# Patient Record
Sex: Male | Born: 1945
Health system: Southern US, Community
[De-identification: ages and names within clinical notes are randomized; demographics above are authoritative.]

## PROBLEM LIST (undated history)

## (undated) DIAGNOSIS — U071 COVID-19: Secondary | ICD-10-CM

## (undated) DIAGNOSIS — N183 Chronic kidney disease, stage 3 unspecified: Secondary | ICD-10-CM

## (undated) DIAGNOSIS — M5030 Other cervical disc degeneration, unspecified cervical region: Secondary | ICD-10-CM

## (undated) DIAGNOSIS — M4800 Spinal stenosis, site unspecified: Secondary | ICD-10-CM

## (undated) DIAGNOSIS — K219 Gastro-esophageal reflux disease without esophagitis: Secondary | ICD-10-CM

## (undated) DIAGNOSIS — M545 Low back pain, unspecified: Secondary | ICD-10-CM

## (undated) DIAGNOSIS — G4700 Insomnia, unspecified: Secondary | ICD-10-CM

## (undated) DIAGNOSIS — C801 Malignant (primary) neoplasm, unspecified: Secondary | ICD-10-CM

## (undated) DIAGNOSIS — E782 Mixed hyperlipidemia: Secondary | ICD-10-CM

## (undated) DIAGNOSIS — Z8679 Personal history of other diseases of the circulatory system: Secondary | ICD-10-CM

## (undated) HISTORY — DX: Low back pain: M54.5

## (undated) HISTORY — PX: COLONOSCOPY: SHX174

## (undated) HISTORY — PX: INGUINAL HERNIA REPAIR: SUR1180

## (undated) HISTORY — DX: Insomnia, unspecified: G47.00

## (undated) HISTORY — PX: CHOLECYSTECTOMY: SHX55

## (undated) HISTORY — PX: APPENDECTOMY: SHX54

## (undated) HISTORY — PX: VEIN LIGATION AND STRIPPING: SHX2653

## (undated) HISTORY — DX: Mixed hyperlipidemia: E78.2

## (undated) HISTORY — DX: COVID-19: U07.1

## (undated) HISTORY — PX: CARDIAC CATHETERIZATION: SHX172

## (undated) HISTORY — DX: Spinal stenosis, site unspecified: M48.00

## (undated) HISTORY — DX: Gastro-esophageal reflux disease without esophagitis: K21.9

## (undated) HISTORY — DX: Other cervical disc degeneration, unspecified cervical region: M50.30

## (undated) HISTORY — DX: Low back pain, unspecified: M54.50

## (undated) HISTORY — DX: Chronic kidney disease, stage 3 unspecified: N18.30

## (undated) HISTORY — DX: Personal history of other diseases of the circulatory system: Z86.79

## (undated) HISTORY — PX: OTHER SURGICAL HISTORY: SHX169

---

## 2000-04-24 ENCOUNTER — Emergency Department (HOSPITAL_COMMUNITY): Admission: EM | Admit: 2000-04-24 | Discharge: 2000-04-24 | Payer: Self-pay | Admitting: Emergency Medicine

## 2000-06-26 ENCOUNTER — Ambulatory Visit (HOSPITAL_COMMUNITY): Admission: RE | Admit: 2000-06-26 | Discharge: 2000-06-26 | Payer: Self-pay | Admitting: *Deleted

## 2000-06-26 ENCOUNTER — Encounter: Payer: Self-pay | Admitting: *Deleted

## 2009-03-02 ENCOUNTER — Encounter: Admission: RE | Admit: 2009-03-02 | Discharge: 2009-03-02 | Payer: Self-pay | Admitting: Neurosurgery

## 2009-04-20 ENCOUNTER — Encounter: Admission: RE | Admit: 2009-04-20 | Discharge: 2009-04-20 | Payer: Self-pay | Admitting: Neurosurgery

## 2009-05-03 ENCOUNTER — Ambulatory Visit: Payer: Self-pay | Admitting: Vascular Surgery

## 2009-08-09 ENCOUNTER — Ambulatory Visit: Payer: Self-pay | Admitting: Vascular Surgery

## 2009-09-06 ENCOUNTER — Ambulatory Visit: Payer: Self-pay | Admitting: Vascular Surgery

## 2009-09-15 ENCOUNTER — Ambulatory Visit: Payer: Self-pay | Admitting: Vascular Surgery

## 2009-10-27 ENCOUNTER — Ambulatory Visit: Payer: Self-pay | Admitting: Vascular Surgery

## 2010-04-16 ENCOUNTER — Ambulatory Visit: Payer: Self-pay | Admitting: Cardiovascular Disease

## 2010-04-16 ENCOUNTER — Observation Stay (HOSPITAL_COMMUNITY): Admission: EM | Admit: 2010-04-16 | Discharge: 2010-04-17 | Payer: Self-pay | Admitting: Emergency Medicine

## 2010-12-06 ENCOUNTER — Ambulatory Visit (INDEPENDENT_AMBULATORY_CARE_PROVIDER_SITE_OTHER): Payer: Self-pay | Admitting: Internal Medicine

## 2010-12-06 DIAGNOSIS — R1013 Epigastric pain: Secondary | ICD-10-CM

## 2010-12-06 DIAGNOSIS — R1011 Right upper quadrant pain: Secondary | ICD-10-CM

## 2010-12-06 DIAGNOSIS — Z7982 Long term (current) use of aspirin: Secondary | ICD-10-CM

## 2010-12-15 LAB — COMPREHENSIVE METABOLIC PANEL
Albumin: 3.8 g/dL (ref 3.5–5.2)
Alkaline Phosphatase: 65 U/L (ref 39–117)
BUN: 25 mg/dL — ABNORMAL HIGH (ref 6–23)
Calcium: 8.9 mg/dL (ref 8.4–10.5)
Potassium: 4 mEq/L (ref 3.5–5.1)
Sodium: 136 mEq/L (ref 135–145)
Total Protein: 6.6 g/dL (ref 6.0–8.3)

## 2010-12-15 LAB — CBC
HCT: 38.7 % — ABNORMAL LOW (ref 39.0–52.0)
HCT: 39 % (ref 39.0–52.0)
Hemoglobin: 13 g/dL (ref 13.0–17.0)
MCH: 31.5 pg (ref 26.0–34.0)
MCH: 32.3 pg (ref 26.0–34.0)
MCHC: 33.3 g/dL (ref 30.0–36.0)
MCHC: 34.2 g/dL (ref 30.0–36.0)
MCV: 94.6 fL (ref 78.0–100.0)
MCV: 94.6 fL (ref 78.0–100.0)
Platelets: 141 10*3/uL — ABNORMAL LOW (ref 150–400)
Platelets: 147 10*3/uL — ABNORMAL LOW (ref 150–400)
RBC: 4.12 MIL/uL — ABNORMAL LOW (ref 4.22–5.81)
RDW: 13.1 % (ref 11.5–15.5)
RDW: 13.1 % (ref 11.5–15.5)
WBC: 6.7 10*3/uL (ref 4.0–10.5)
WBC: 8.4 10*3/uL (ref 4.0–10.5)

## 2010-12-15 LAB — POCT I-STAT, CHEM 8
BUN: 26 mg/dL — ABNORMAL HIGH (ref 6–23)
Calcium, Ion: 0.91 mmol/L — ABNORMAL LOW (ref 1.12–1.32)
Chloride: 109 mEq/L (ref 96–112)
Creatinine, Ser: 1 mg/dL (ref 0.4–1.5)
Glucose, Bld: 114 mg/dL — ABNORMAL HIGH (ref 70–99)
HCT: 40 % (ref 39.0–52.0)
Hemoglobin: 13.6 g/dL (ref 13.0–17.0)
Potassium: 4 mEq/L (ref 3.5–5.1)
Sodium: 135 mEq/L (ref 135–145)
TCO2: 19 mmol/L (ref 0–100)

## 2010-12-15 LAB — CK TOTAL AND CKMB (NOT AT ARMC)
CK, MB: 2 ng/mL (ref 0.3–4.0)
Relative Index: INVALID (ref 0.0–2.5)
Total CK: 52 U/L (ref 7–232)

## 2010-12-15 LAB — BASIC METABOLIC PANEL
BUN: 18 mg/dL (ref 6–23)
Calcium: 8.9 mg/dL (ref 8.4–10.5)
Chloride: 104 mEq/L (ref 96–112)
Creatinine, Ser: 1.12 mg/dL (ref 0.4–1.5)
GFR calc Af Amer: >60 mL/min (ref >60)
GFR calc non Af Amer: >60 mL/min (ref >60)

## 2010-12-15 LAB — POCT CARDIAC MARKERS
CKMB, poc: <1 ng/mL — ABNORMAL LOW (ref 1.0–8.0)
Myoglobin, poc: 68.5 ng/mL (ref 12–200)

## 2010-12-15 LAB — LIPID PANEL
Cholesterol: 150 mg/dL (ref 0–200)
HDL: 57 mg/dL (ref >39)
Triglycerides: 69 mg/dL (ref <150)
VLDL: 14 mg/dL (ref 0–40)

## 2010-12-15 LAB — D-DIMER, QUANTITATIVE: D-Dimer, Quant: 0.24 ug/mL-FEU (ref 0.00–0.48)

## 2010-12-15 LAB — CARDIAC PANEL(CRET KIN+CKTOT+MB+TROPI)
CK, MB: 1.7 ng/mL (ref 0.3–4.0)
Relative Index: INVALID (ref 0.0–2.5)
Relative Index: INVALID (ref 0.0–2.5)
Total CK: 42 U/L (ref 7–232)
Troponin I: 0.01 ng/mL (ref 0.00–0.06)
Troponin I: 0.01 ng/mL (ref 0.00–0.06)

## 2010-12-15 LAB — PROTIME-INR
INR: 1.02 (ref 0.00–1.49)
Prothrombin Time: 13.3 seconds (ref 11.6–15.2)

## 2010-12-15 LAB — TROPONIN I: Troponin I: 0.03 ng/mL (ref 0.00–0.06)

## 2010-12-15 LAB — TSH: TSH: 1.652 u[IU]/mL (ref 0.350–4.500)

## 2011-01-02 ENCOUNTER — Encounter (HOSPITAL_BASED_OUTPATIENT_CLINIC_OR_DEPARTMENT_OTHER): Payer: PRIVATE HEALTH INSURANCE | Admitting: Internal Medicine

## 2011-01-02 ENCOUNTER — Ambulatory Visit (HOSPITAL_COMMUNITY)
Admission: RE | Admit: 2011-01-02 | Discharge: 2011-01-02 | Disposition: A | Discharge status: Home / Self Care | Payer: PRIVATE HEALTH INSURANCE | Source: Ambulatory Visit | Attending: Internal Medicine | Admitting: Internal Medicine

## 2011-01-02 DIAGNOSIS — R1013 Epigastric pain: Secondary | ICD-10-CM | POA: Insufficient documentation

## 2011-01-02 DIAGNOSIS — R1011 Right upper quadrant pain: Secondary | ICD-10-CM

## 2011-01-02 DIAGNOSIS — Z79899 Other long term (current) drug therapy: Secondary | ICD-10-CM | POA: Insufficient documentation

## 2011-01-02 DIAGNOSIS — Z7982 Long term (current) use of aspirin: Secondary | ICD-10-CM | POA: Insufficient documentation

## 2011-01-02 LAB — HEPATIC FUNCTION PANEL
ALT: 17 U/L (ref 0–53)
Albumin: 3.5 g/dL (ref 3.5–5.2)
Alkaline Phosphatase: 51 U/L (ref 39–117)
Indirect Bilirubin: 1.1 mg/dL — ABNORMAL HIGH (ref 0.3–0.9)
Total Protein: 5.8 g/dL — ABNORMAL LOW (ref 6.0–8.3)

## 2011-01-15 NOTE — Consult Note (Signed)
NAME:  Kurt Duke, HAZEL NO.:  [id number]  MEDICAL RECORD NO.:  [id number]           PATIENT TYPE: AMB.  LOCATION: [place].                   [facility]  PHYSICIAN:  Lionel December, M.D.    DATE OF BIRTH:  March 26, 1946  DATE : 12/06/2010.                                 OFFICE VISIT.   REASON FOR CONSULTATION:  Chest pain/EGD.  HISTORY OF PRESENT ILLNESS:  Mr. Kurt Duke is a 65 year old white male referred to our office by Dr. Sherril Croon for chest pain.  He complains of epigastric pain and pain in his right upper quadrant.  He states the pain becomes worse if he rides for a long time or if he eats.  This pain has been occurring for several months.  The pain is actually located under his right anterior ribs.  He does occasionally have some nausea. He has no vomiting.  His appetite has been good.  He does state he has early satiety because he feels full.  He complains of fullness in his abdomen.  He complains of a lot of gas.  There has been no weight loss.  He has a bowel movement about 1-2 times a week and uses Dulcolax. He denies any fever, fatigue, or weight loss.  There has been no dysphagia.  No jaundice.  No vomiting but he does have nausea.  He says his last colonoscopy was about 10 years ago by Dr. Linna Darner and it was normal.  LABS:  September 09, 2010, his BUN was 23, creatinine was 1.10, potassium 4.0, chloride 100, carbon dioxide 21, calcium 9.4, protein 6.8, albumin 4.4, globulin 2.4.  Total bilirubin 0.6, ALP 72, AST 20, ALT 13.  WBC is 8.2, RBC 4.36.  Hemoglobin 13.8, hematocrit 38.9, MCV 89, and his platelets 13.2.  TSH was 1.310.  PSA was 2.5.  There are no known allergies.  HOME MEDICATIONS: 1. Vitamin D 1000 units 2 a day. 2. Ambien 10 mg at night. 3. Omeprazole 20 mg daily. 4. Fish oil 1000 mg a day. 5. Dulcolax as needed. 6. Tylenol Arthritis as needed. 7. Aspirin 81 mg a day. 8. Beano as needed. 9. Hydrocodone/Tylenol as  needed. 10.Anti-Gas as needed. 11.Motrin 200 mg p.r.n. which is rare. 12.Oxycodone as needed.  SURGERY:  He has had a neuroma removed from his right foot.  He has had arthroscopy of his right knee and then he had open surgery to his right knee.  He has had a left knee arthroscopy, tonsillectomy, cholecystectomy 10 years ago, a vein stripping to his left leg, appendectomy, and bilateral inguinal hernia repair.  MEDICAL HISTORY:  Back pain.  FAMILY HISTORY:  His mother deceased at age 60.  His father is deceased when he was a child from an accident.  One sister with diabetes type 2 and she has had throat and breast cancer.  SOCIAL HISTORY:  He is married.  He works at [employer] in [place]. He does not smoke or do drugs and he occasionally drinks 1-2 a week. Three children, two in good health.  One is deceased from an overdose.  OBJECTIVE:  VITAL SIGNS:  His weight is 181, his  height is 5 feet 11 inches, his temp is 97.3, his blood pressure is 104/82, and his pulse is 76. HEENT:  He has natural teeth.  His oral mucosa is moist.  His conjunctivae pink.  His sclerae anicteric. NECK:  His thyroid is normal.  There is no cervical lymphadenopathy. LUNGS:  Clear. HEART:  Regular rate and rhythm. ABDOMEN:  Soft.  Bowel sounds are positive.  No masses.  There is no hepatomegaly.  He does have tenderness to the epigastric region.  There is no edema to his extremities.  ASSESSMENT:  Shahzad is a 65 year old male with complaints of epigastric and right upper quadrant pain.  Peptic ulcer disease needs to be ruled out.  His labs so far have been normal.  RECOMMENDATIONS:  We will schedule an EGD with Dr. Karilyn Cota in the near future.  If normal, he will probably need further studies such as a CT of the abdomen.    ______________________________ Dorene Ar, NP   ______________________________ Lionel December, M.D.    TS/MEDQ  D:  12/06/2010  T:  12/07/2010  Job:  [id number]  cc:   Dr.  Sherril Croon  Electronically Signed by Dorene Ar PA on 01/02/2011 09:11:40 AM Electronically Signed by Lionel December M.D. on 01/15/2011 10:25:41 AM

## 2011-01-15 NOTE — Op Note (Signed)
  NAME:  Kurt Duke, Kurt Duke NO.:  [id number]  MEDICAL RECORD NO.:  [id number]           PATIENT TYPE:  O  LOCATION:  [place]                          FACILITY:  [facility]  PHYSICIAN:  Lionel December, M.D.    DATE OF BIRTH:  02-17-1946  DATE OF PROCEDURE: DATE OF DISCHARGE:                              OPERATIVE REPORT   PROCEDURE:  Esophagogastroduodenoscopy.  INDICATION:  Kurt Duke is a 65 year old Caucasian male who has been experiencing epigastric pain for the last 3 to 4 months.  He has been on omeprazole but does not feel any better.  He had normal CBC and LFTs. He has had gallbladder removed 10 years ago.  He has been using lot of OTC Motrin, but he stopped it but is still having his pain.  He has not experienced any melena or anorexia, weight loss.  He is undergoing diagnostic EGD.  Procedure risk reviewed with the patient, informed consent was obtained.  MEDS FOR CONSCIOUS SEDATION:  Cetacaine spray for pharyngeal topical anesthesia, Demerol 50 mg IV, Versed 8 mg IV.  FINDINGS:  Procedure performed in endoscopy suite.  The patient's vital signs and O2 sat were monitored during the procedure and remained stable.  The patient was placed in left lateral recumbent position and Pentax videoscope was passed through oropharynx without any difficulty into esophagus.  Esophagus:  Mucosa of the esophagus is normal.  GE junction was located at 40 cm from the incisors and was unremarkable.  Stomach was empty and distended very well insufflation.  Folds of proximal stomach are normal.  Examination of mucosa at body, antrum, pyloric channel as well as angularis, fundus, and cardia was normal.  Duodenum, bulbar mucosa was normal.  Scope was passed through second part of duodenal mucosa and folds were normal.  Ampulla were also well seen and was normal.  Endoscope was withdrawn.  The patient tolerated the procedure well.  FINAL DIAGNOSIS:  Normal  esophagogastroduodenoscopy.  RECOMMENDATIONS:  The patient will continue to use omeprazole at current dose just 20 mg daily.  He will continue to refrain from using NSAIDs, but can stay on his ASA 81 mg daily.  Abdominopelvic CT will be scheduled in near future.     Lionel December, M.D.     NR/MEDQ  D:  01/02/2011  T:  01/03/2011  Job:  [id number]  cc:   Dr. Sherril Croon  Electronically Signed by Lionel December M.D. on 01/15/2011 10:26:02 AM

## 2011-02-12 NOTE — Assessment & Plan Note (Signed)
OFFICE VISIT   Kurt Duke, Kurt Duke  DOB:  07/20/1946                                       08/09/2009  AOZHY#:[id number]   This patient presents today for continued discussion of his left leg  venous pathology.  He has attempted graduated compression use and this  has not improved the pain.  He specifically has pain over his pretibial  area, and this is related mostly to standing.  He does have some  discomfort with elevation.  He reports this is an achy discomfort.  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  This is very difficult due to pain over the  varicosities and also reports that yard work and walking are difficult  due to the leg pain.   REVIEW OF SYSTEMS:  Otherwise unremarkable.  He has no cardiac,  pulmonary, GI or GU difficulties.   PHYSICAL EXAMINATION:  Unchanged.  He does have 2+ dorsalis pedis pulse.  He has marked varicosities over left pretibial area.   I imaged this today with a duplex again showing the perforator in the  lateral aspect of his calf extending into the large varicosities.   I again discussed this with the patient.  He has clearly failed  conservative treatment and have recommended laser ablation of his left  perforator vein and stab phlebectomy of multiple tributary varicosities.  He understands the procedure and wishes to proceed when we can get him  on the schedule and assure insurance coverage.   Larina Earthly, M.D.  Electronically Signed   TFE/MEDQ  D:  08/09/2009  T:  08/10/2009  Job:  [id number]

## 2011-02-12 NOTE — Assessment & Plan Note (Signed)
OFFICE VISIT   EZRAH, PANNING  DOB:  Apr 21, 1946                                       09/15/2009  ZOXWR#:[id number]   The patient presents today for 1-week follow-up of his laser ablation of  his left proximal calf perforator and multiple stab phlebectomies.  He  had minimal discomfort following this procedure.  He has mild bruising  in his incisions.  Steri-Strip sites are  separating.  He underwent  duplex in our office that shows no evidence of DVT and shows ablation of  his reticular.  I am quite pleased with his initial result.  He will  wear his compression garment for 1 additional month.  We will see him  again in 6 weeks for final follow-up.     Larina Earthly, M.D.  Electronically Signed   TFE/MEDQ  D:  09/15/2009  T:  09/19/2009  Job:  [id number]   cc:   Erasmo Downer, MD  Payton Doughty, M.D.

## 2011-02-12 NOTE — Assessment & Plan Note (Signed)
OFFICE VISIT   Kurt Duke, Kurt Duke  DOB:  1946-02-07                                       09/06/2009  JXBJY#:[id number]   The patient presents today for treatment of symptomatic left leg venous  varicosities.  He had a perforator in the anterior tibial area just  below the knee and this was treated with laser ablation followed by stab  phlebectomy of multiple tributary varicosities through his calf and  pretibial area.  He had no immediate complications and will be seen  again in 1 week for followup.   Larina Earthly, M.D.  Electronically Signed   TFE/MEDQ  D:  09/06/2009  T:  09/07/2009  Job:  [id number]

## 2011-02-12 NOTE — Assessment & Plan Note (Signed)
OFFICE VISIT   MAYNARD, Kurt Duke  DOB:  [birth year]                                       10/27/2009  BMWUX#:[id number]   The patient presents today for follow-up of his laser ablation of a  perforator from the anterior tibial vein in his left pretibial area and  stab phlebectomy of multiple tributary varicosities.  He has done quite  well.  He reports no further discomfort.  His phlebectomy sites have all  healed, his bruising is all resolved.  I did image this area again with  the SonoSite and this shows complete closure of the perforator.  I am  pleased with his initial result as is the patient. Plan to see him again  on an as-needed basis.     Larina Earthly, M.D.  Electronically Signed   TFE/MEDQ  D:  10/27/2009  T:  10/30/2009  Job:  [id number]

## 2011-02-12 NOTE — Procedures (Signed)
LOWER EXTREMITY VENOUS REFLUX EXAM   INDICATION:  Left calf varicose vein.   EXAM:  Using color-flow imaging and pulse Doppler spectral analysis, the  right and left common femoral, superficial femoral, popliteal, posterior  tibial, greater and lesser saphenous veins are evaluated.  There is  evidence suggesting deep venous insufficiency in the right and left  lower extremity.   The right and left saphenofemoral junctions are competent.  The right  and left GSV are competent with the caliber as described below.   The right and left proximal short saphenous veins demonstrates  competency.   GSV Diameter (used if found to be incompetent only)                                            Right    Left  Proximal Greater Saphenous Vein           cm       cm  Proximal-to-mid-thigh                     cm       cm  Mid thigh                                 cm       cm  Mid-distal thigh                          cm       cm  Distal thigh                              cm       cm  Knee                                      cm       cm   IMPRESSION:  1. Right and left greater saphenous veins are identified.  2. Right and left greater saphenous veins are not aneurysmal.  3. Right and left greater saphenous veins are not tortuous.  4. The deep venous system is not competent with reflux of >500      milliseconds bilaterally.  5. The right and left lesser saphenous veins are competent.  6. On the right there is an incompetent deep calf vein right off of      the popliteal vein (gastrocnemius).  7. On the left there is a large perforator on the anterolateral aspect      of the proximal calf of the anterior tibial vein which gives rise      to a large network of tortuous varicosities.  The perforator is      approximately 0.6 cm in diameter.  The varicose vein is      approximately 0.4 cm in diameter.       ___________________________________________  Larina Earthly, M.D.   MC/MEDQ  D:  05/03/2009  T:  05/03/2009  Job:  [id number]

## 2011-02-12 NOTE — Procedures (Signed)
DUPLEX DEEP VENOUS EXAM - LOWER EXTREMITY   INDICATION:  Followup left calf perforator ablation.   HISTORY:  Edema:  No.  Trauma/Surgery:  Left tibial perforator vein ablation 09/06/2009 by Dr.  Arbie Cookey.  Pain:  No.  PE:  No.  Previous DVT:  No.  Anticoagulants:  No.  Other:   DUPLEX EXAM:                CFV   SFV   PopV  PTV    GSV                R  L  R  L  R  L  R   L  R  L  Thrombosis    0  0     0     0      0     0  Spontaneous   +  +     +     +      +     +  Phasic        +  +     +     +      +     +  Augmentation  +  +     +     +      +     +  Compressible  +  +     +     +      +     +  Competent     0  0     +     +      +     +   Legend:  + - yes  o - no  p - partial  D - decreased   IMPRESSION:  1. No evidence of deep venous thrombosis in the left lower extremity      or right common femoral vein.  2. Evidence of ablated left proximal calf (anterior / lateral)      perforator without flow.  3. Evidence of thrombosed varicosities in the left calf.    _____________________________  Larina Earthly, M.D.   AS/MEDQ  D:  09/15/2009  T:  09/16/2009  Job:  [id number]

## 2011-02-12 NOTE — Consult Note (Signed)
NEW PATIENT CONSULTATION   Kurt Duke, Kurt Duke  DOB:  06/05/1946                                       05/03/2009  ZOXWR#:[id number]   The patient presents today for evaluation of left leg venous  varicosities.  He reports that these have been present for many years,  but have become progressively more severe and more painful to him over  the past several months.  He does not have any history of deep vein  thrombosis or superficial thrombophlebitis.  He reports that the  varicosities cause him pain to touch over the varicosities with  prolonged standing, and also a tired achy sensation in his left leg.  He  does have swelling below this.  He does not have any history of bleeding  with his biopsies.   PAST HISTORY:  Significant for no major cardiac difficulties.  He is  married with 3 children and works as a Control and instrumentation engineer.  He does not  smoke and has occasional social alcohol consumption weekly.   REVIEW OF SYSTEMS:  Positive for weight reported 170 pounds.  He is 5  feet 11 inches tall.  Review of systems otherwise negative.   CURRENT MEDICATIONS:  Ambien for sleep and codeine as needed for pain.   PHYSICAL EXAM:  Well-developed, well-nourished white male appearing  stated age of 34.  His blood pressure is 128/88, pulse 80, respirations  18.  He is grossly intact neurologically.  He has 2+ radial and 2+  posterior tibial pulses bilaterally.  He does not have any varicosities  in his right leg.  His left leg is noted for varicosities beginning just  below his knee and just lateral to the tibia, extending down onto his  lateral calf.   He underwent noninvasive vascular laboratory studies in our office and  this revealed no evidence of incompetence in his small or great  saphenous vein.  He does have incompetence in the common femoral and  popliteal veins.  He has a large perforator at the proximal-most portion  of the varicosities in his left tibial area and this  perforator extends  into the large varicosity with reflux.  I discussed options with the  patient.  He has not tried compression garments, and I have recommended  this as our initial treatment.  He understands that he should continue  elevation when possible to reduce the venous hypertension and also will  use Motrin and antiinflammatories p.r.n. pain.  We have fitted him today  with thigh high graduated compression garments, and instructed him on  their use.  We plan to see him again in 3 months.  I did discuss the  option of laser ablation of the perforator vein and stab phlebectomy of  the underlying varicosities should he fail conservative treatment.  We  will see him again in 3 months.   Larina Earthly, M.Duke.  Electronically Signed   TFE/MEDQ  Duke:  05/03/2009  T:  05/03/2009  Job:  [id number]   cc:   Payton Doughty, M.Duke.  Dr. Judie Petit. Kathie Rhodes. Linna Darner

## 2011-03-12 ENCOUNTER — Ambulatory Visit (INDEPENDENT_AMBULATORY_CARE_PROVIDER_SITE_OTHER): Payer: PRIVATE HEALTH INSURANCE | Admitting: Internal Medicine

## 2011-03-12 ENCOUNTER — Ambulatory Visit (INDEPENDENT_AMBULATORY_CARE_PROVIDER_SITE_OTHER): Payer: Medicare Other | Admitting: Internal Medicine

## 2011-03-12 DIAGNOSIS — K915 Postcholecystectomy syndrome: Secondary | ICD-10-CM

## 2011-03-12 DIAGNOSIS — R1011 Right upper quadrant pain: Secondary | ICD-10-CM

## 2011-03-13 ENCOUNTER — Other Ambulatory Visit (INDEPENDENT_AMBULATORY_CARE_PROVIDER_SITE_OTHER): Payer: Self-pay | Admitting: Internal Medicine

## 2011-03-13 DIAGNOSIS — R1011 Right upper quadrant pain: Secondary | ICD-10-CM

## 2011-03-14 ENCOUNTER — Ambulatory Visit (INDEPENDENT_AMBULATORY_CARE_PROVIDER_SITE_OTHER): Payer: Medicare Other | Admitting: Internal Medicine

## 2011-03-21 ENCOUNTER — Ambulatory Visit (HOSPITAL_COMMUNITY)
Admission: RE | Admit: 2011-03-21 | Discharge: 2011-03-21 | Disposition: A | Discharge status: Home / Self Care | Payer: PRIVATE HEALTH INSURANCE | Source: Ambulatory Visit | Attending: Internal Medicine | Admitting: Internal Medicine

## 2011-03-21 DIAGNOSIS — R1011 Right upper quadrant pain: Secondary | ICD-10-CM | POA: Insufficient documentation

## 2012-07-17 IMAGING — US US ABDOMEN COMPLETE
1 series · 14 of 25 positions shown · non-contrast
Comparison: None

CLINICAL DATA: Right upper quadrant pain

ULTRASOUND ABDOMEN:
TECHNIQUE: Sonography of upper abdominal structures was performed.

[Series 1: us abdomen complete · 0.18mm/px · 14 of 55 slices shown]
[im 1/55]
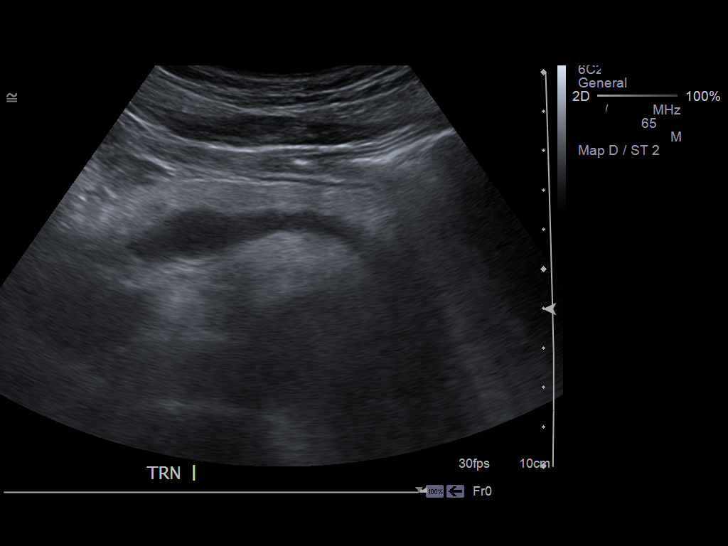
[im 5/55]
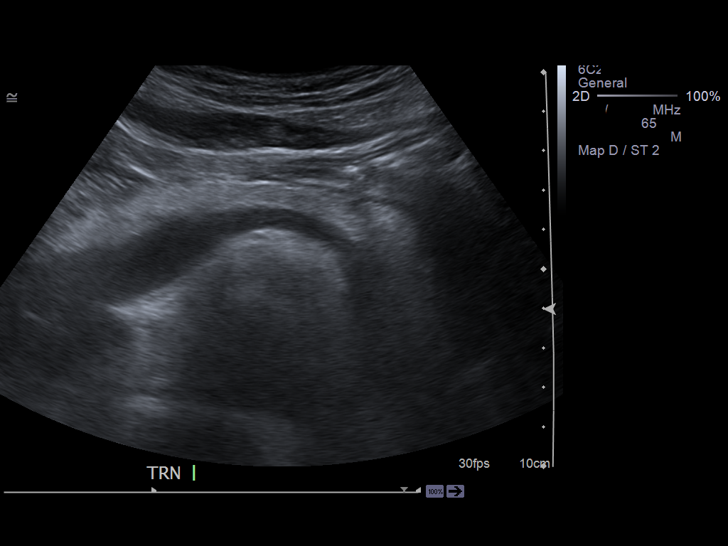
[im 10/55]
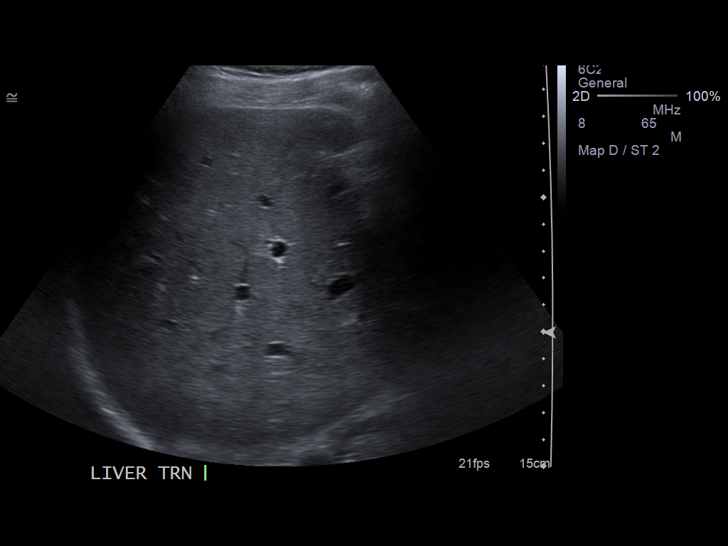
[im 14/55]
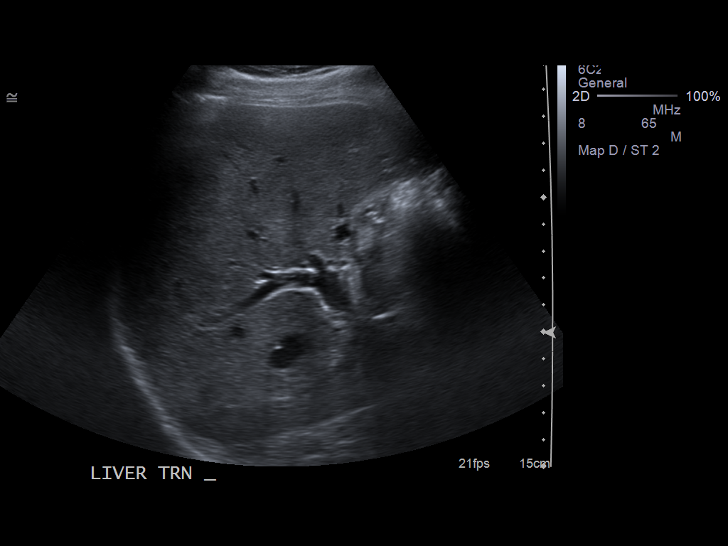
[im 19/55]
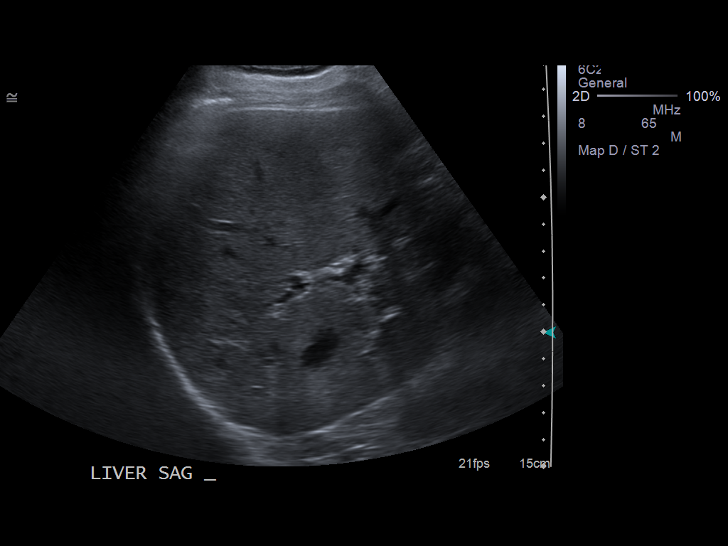
[im 21/55]
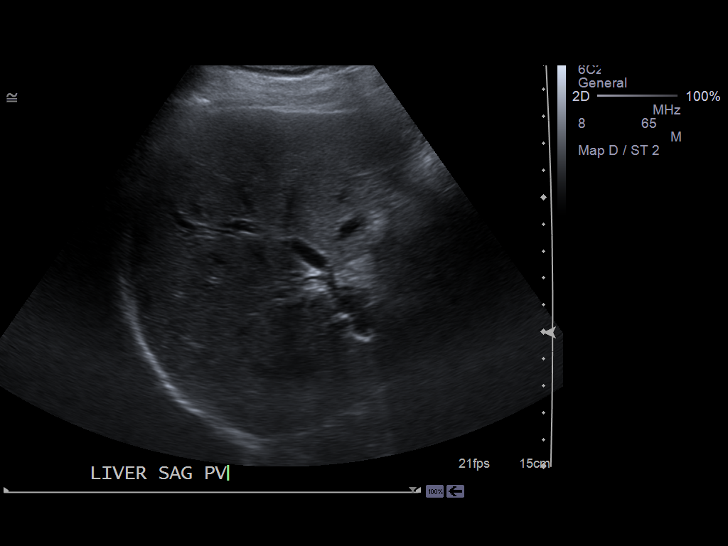
[im 25/55]
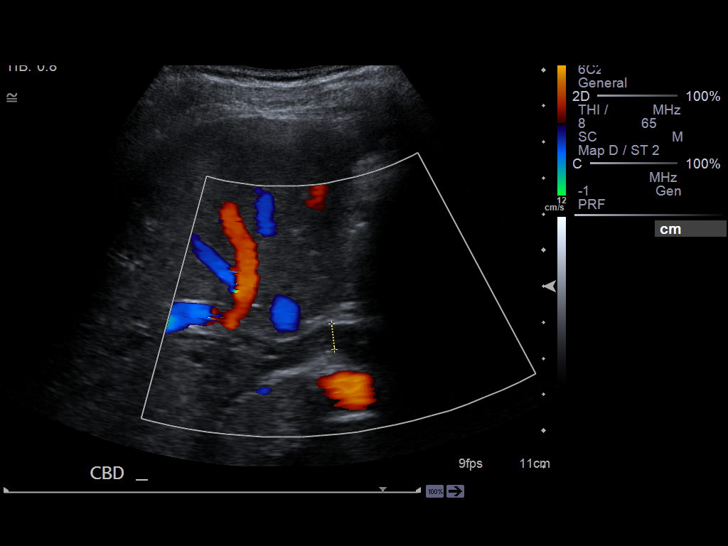
[im 30/55]
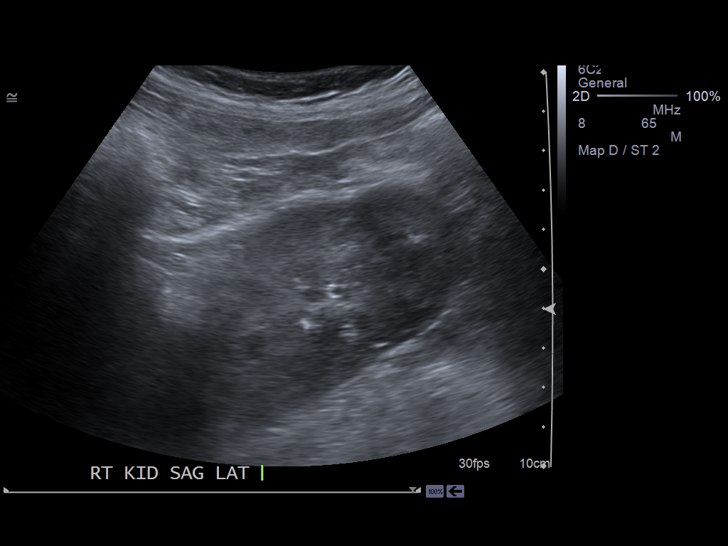
[im 34/55]
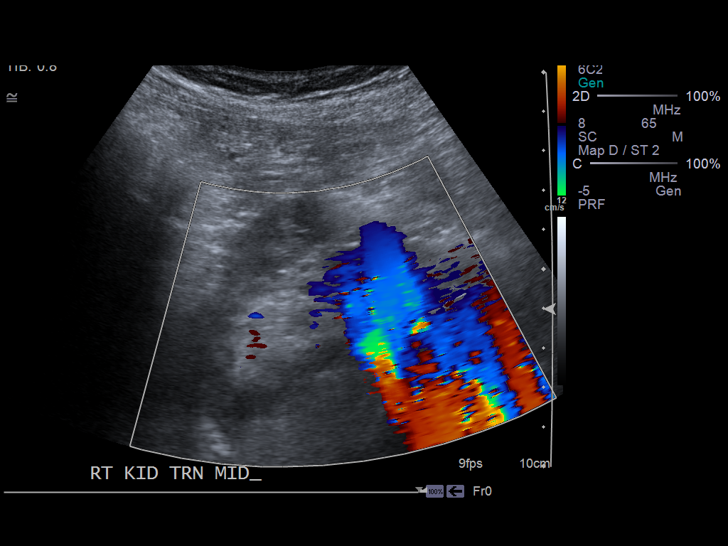
[im 37/55]
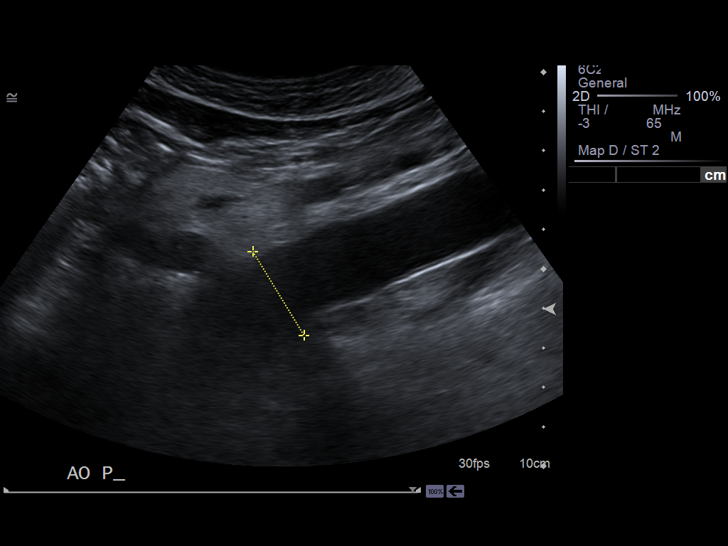
[im 41/55]
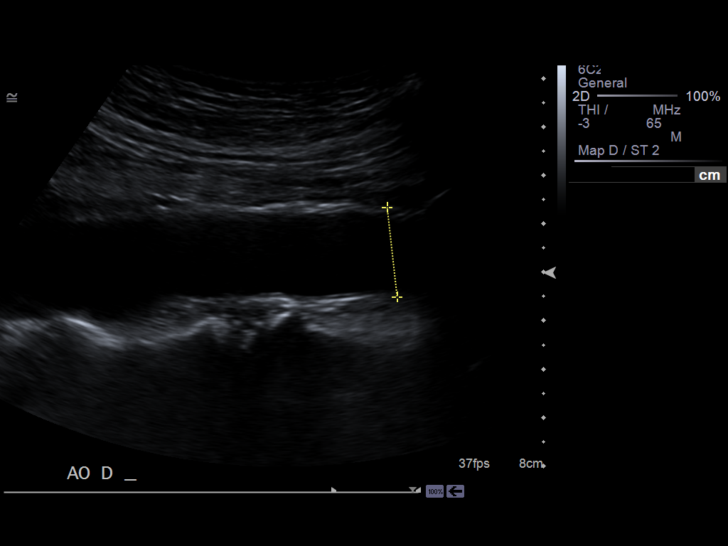
[im 46/55]
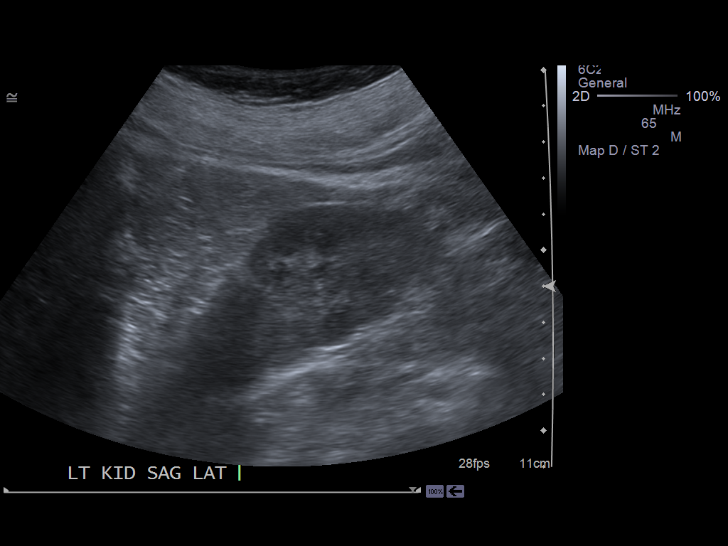
[im 50/55]
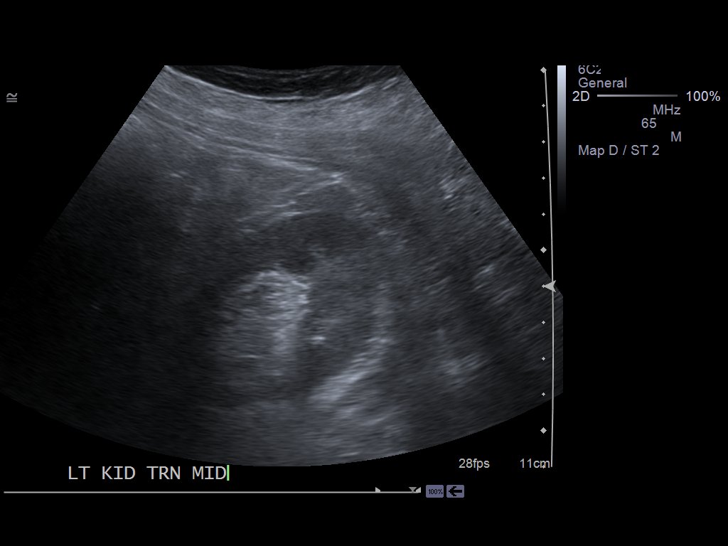
[im 55/55]
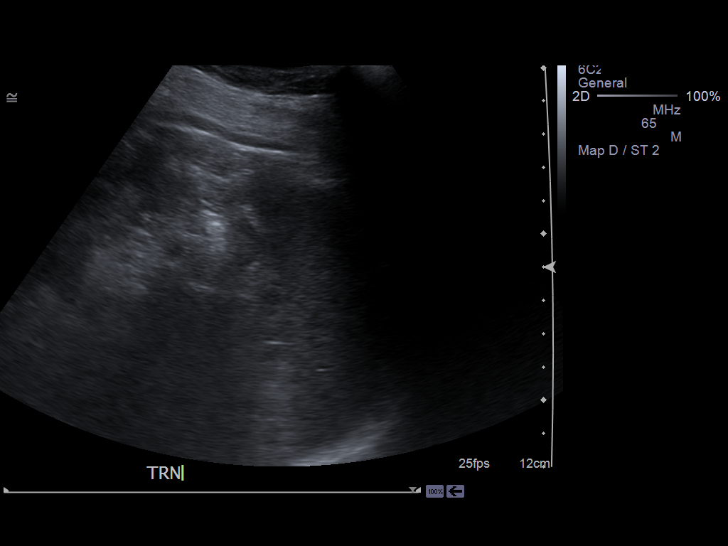

[14 of 25 positions shown; findings below may reference images not displayed]

Gallbladder:  Surgically absent

Common bile duct:  Normal caliber for post cholecystectomy and age,
7 mm diameter.

Liver:  Normal appearance.  Hepatopetal portal venous flow.

IVC:  Normal appearance

Pancreas:  Normal appearance

Spleen:  4.4 cm length.  Normal morphology.

Right kidney:  9.5 cm length. Normal morphology without mass or
hydronephrosis.

Left kidney:  10.9 cm length. Normal morphology without mass or
hydronephrosis.

Aorta:  Normal caliber

Other:  No free fluid
IMPRESSION: Post cholecystectomy.
Otherwise normal exam.

## 2012-09-21 ENCOUNTER — Encounter: Payer: Self-pay | Admitting: Cardiology

## 2012-10-05 ENCOUNTER — Ambulatory Visit (INDEPENDENT_AMBULATORY_CARE_PROVIDER_SITE_OTHER): Payer: Medicare Other | Admitting: Cardiology

## 2012-10-05 ENCOUNTER — Encounter: Payer: Self-pay | Admitting: Cardiology

## 2012-10-05 ENCOUNTER — Encounter: Payer: Self-pay | Admitting: *Deleted

## 2012-10-05 VITALS — BP 130/83 | HR 95 | Ht 72.0 in | Wt 176.0 lb

## 2012-10-05 DIAGNOSIS — R072 Precordial pain: Secondary | ICD-10-CM | POA: Insufficient documentation

## 2012-10-05 NOTE — Patient Instructions (Addendum)
Your physician recommends that you schedule a follow-up appointment as needed. Your physician recommends that you continue on your current medications as directed. Please refer to the Current Medication list given to you today.  Your physician has requested that you have an exercise tolerance test. For further information please visit [email or web address] Please also follow instruction sheet, as given.

## 2012-10-05 NOTE — Progress Notes (Signed)
HPI The patient presents for evaluation of chest discomfort. He has a history of this with catheterization in 2001 and most recently in 2011. He had mild nonobstructive plaque and I did review these results. He now says he's been having chest discomfort since about Thanksgiving.  He had one severe episode which was 7/10 in intensity. He describes some mid sternal discomfort and discomfortunder his left breast. It was somewhat sharp. It was associated with diaphoresis and mild nausea. He's been getting this intermittently. He cannot bring it on with activity. It seems to occur at rest and sporadically. He might last up to 20-30 minutes. He's not sure whether it is similar to his previous discomfort. He hasn't been overly active though he walks up and down stairs without bringing on any discomfort. He denies shortness of breath, PND or orthopnea. He has had no weight gain or edema. He did have an echocardiogram done last month which was negative for any evidence of wall motion abnormality.   Allergies  Allergen Reactions  . Doxycycline Other (See Comments)    Skin sensitivity    Current Outpatient Prescriptions  Medication Sig Dispense Refill  . aspirin EC 81 MG tablet Take 81 mg by mouth daily.      . Cholecalciferol (VITAMIN D3) 2000 UNITS capsule Take 2,000 Units by mouth daily.      Marland Kitchen docusate sodium (COLACE) 100 MG capsule Take 100-200 mg by mouth daily.      . sildenafil (VIAGRA) 100 MG tablet Take 100 mg by mouth daily as needed.      . traMADol (ULTRAM) 50 MG tablet Take 50 mg by mouth 3 (three) times daily as needed.      . zolpidem (AMBIEN) 10 MG tablet Take 10 mg by mouth at bedtime.        No past medical history on file.  Past Surgical History  Procedure Date  . Colonoscopy     2009  . Cardiac catheterization     2010   ROS:  As stated in the HPI and negative for all other systems.  PHYSICAL EXAM BP 130/83  Pulse 95  Ht 6' (1.829 m)  Wt 176 lb (79.833 kg)  BMI 23.87  kg/m2  SpO2 98% GENERAL:  Well appearing HEENT:  Pupils equal round and reactive, fundi not visualized, oral mucosa unremarkable NECK:  No jugular venous distention, waveform within normal limits, carotid upstroke brisk and symmetric, no bruits, no thyromegaly LYMPHATICS:  No cervical, inguinal adenopathy LUNGS:  Clear to auscultation bilaterally BACK:  No CVA tenderness CHEST:  Unremarkable HEART:  PMI not displaced or sustained,S1 and S2 within normal limits, no S3, no S4, no clicks, no rubs, no murmurs ABD:  Flat, positive bowel sounds normal in frequency in pitch, no bruits, no rebound, no guarding, no midline pulsatile mass, no hepatomegaly, no splenomegaly EXT:  2 plus pulses throughout, no edema, no cyanosis no clubbing SKIN:  No rashes no nodules NEURO:  Cranial nerves II through XII grossly intact, motor grossly intact throughout PSYCH:  Cognitively intact, oriented to person place and time   EKG:  Sinus rhythm, rate 95, left axis deviation, left anterior fascicular block, no acute ST-T wave changes. No change from previous EKGs. 10/05/2012   ASSESSMENT AND PLAN  Chest pain - Chest pain is somewhat atypical. I think the pretest probability of obstructive coronary disease is on the low side. I will bring the patient back for a POET (Plain Old Exercise Test). This will allow me  to screen for obstructive coronary disease, risk stratify and very importantly provide a prescription for exercise.  Abnormal EKG - I was able to find an EKG from 2011 which was not markedly different from this. Again this will be evaluated as above. Given the normal o the treadmill test is normal no further cardiovascular testing would be suggested.

## 2012-10-06 ENCOUNTER — Other Ambulatory Visit: Payer: Self-pay | Admitting: *Deleted

## 2012-10-06 DIAGNOSIS — R072 Precordial pain: Secondary | ICD-10-CM

## 2012-10-09 DIAGNOSIS — R079 Chest pain, unspecified: Secondary | ICD-10-CM

## 2012-10-16 ENCOUNTER — Telehealth: Payer: Self-pay | Admitting: *Deleted

## 2012-10-16 NOTE — Telephone Encounter (Signed)
Patient informed via voicemail. Copy sent to PCP 

## 2012-10-16 NOTE — Telephone Encounter (Signed)
Message copied by Eustace Moore on Fri Oct 16, 2012  3:36 PM ------      Message from: Rollene Rotunda      Created: Wed Oct 14, 2012 12:34 PM       Negative stress test. No further work up.  Call Mr. Lannen with the results and send results to VYAS,DHRUV B., MD

## 2014-03-31 ENCOUNTER — Encounter (INDEPENDENT_AMBULATORY_CARE_PROVIDER_SITE_OTHER): Payer: Medicare Other | Admitting: Ophthalmology

## 2014-03-31 DIAGNOSIS — H4310 Vitreous hemorrhage, unspecified eye: Secondary | ICD-10-CM

## 2014-03-31 DIAGNOSIS — H43819 Vitreous degeneration, unspecified eye: Secondary | ICD-10-CM

## 2014-03-31 DIAGNOSIS — H2510 Age-related nuclear cataract, unspecified eye: Secondary | ICD-10-CM

## 2014-05-12 ENCOUNTER — Encounter (INDEPENDENT_AMBULATORY_CARE_PROVIDER_SITE_OTHER): Payer: Medicare Other | Admitting: Ophthalmology

## 2014-05-12 DIAGNOSIS — H43819 Vitreous degeneration, unspecified eye: Secondary | ICD-10-CM

## 2014-05-12 DIAGNOSIS — H4310 Vitreous hemorrhage, unspecified eye: Secondary | ICD-10-CM

## 2014-05-12 DIAGNOSIS — H35369 Drusen (degenerative) of macula, unspecified eye: Secondary | ICD-10-CM

## 2014-07-12 ENCOUNTER — Encounter (INDEPENDENT_AMBULATORY_CARE_PROVIDER_SITE_OTHER): Payer: Medicare Other | Admitting: Ophthalmology

## 2014-07-13 ENCOUNTER — Encounter (INDEPENDENT_AMBULATORY_CARE_PROVIDER_SITE_OTHER): Payer: Medicare Other | Admitting: Ophthalmology

## 2014-07-13 DIAGNOSIS — H43813 Vitreous degeneration, bilateral: Secondary | ICD-10-CM

## 2014-07-13 DIAGNOSIS — H2513 Age-related nuclear cataract, bilateral: Secondary | ICD-10-CM

## 2016-02-19 ENCOUNTER — Encounter (INDEPENDENT_AMBULATORY_CARE_PROVIDER_SITE_OTHER): Payer: Medicare Other | Admitting: Ophthalmology

## 2016-02-19 DIAGNOSIS — H2513 Age-related nuclear cataract, bilateral: Secondary | ICD-10-CM

## 2016-02-19 DIAGNOSIS — H43813 Vitreous degeneration, bilateral: Secondary | ICD-10-CM | POA: Diagnosis not present

## 2016-02-19 DIAGNOSIS — H4312 Vitreous hemorrhage, left eye: Secondary | ICD-10-CM | POA: Diagnosis not present

## 2016-02-19 DIAGNOSIS — D3132 Benign neoplasm of left choroid: Secondary | ICD-10-CM

## 2016-03-18 DIAGNOSIS — Z299 Encounter for prophylactic measures, unspecified: Secondary | ICD-10-CM | POA: Diagnosis not present

## 2016-03-18 DIAGNOSIS — G4700 Insomnia, unspecified: Secondary | ICD-10-CM | POA: Diagnosis not present

## 2016-03-18 DIAGNOSIS — M545 Low back pain: Secondary | ICD-10-CM | POA: Diagnosis not present

## 2016-04-08 ENCOUNTER — Encounter (INDEPENDENT_AMBULATORY_CARE_PROVIDER_SITE_OTHER): Payer: Medicare Other | Admitting: Ophthalmology

## 2016-04-08 DIAGNOSIS — H4312 Vitreous hemorrhage, left eye: Secondary | ICD-10-CM | POA: Diagnosis not present

## 2016-04-08 DIAGNOSIS — D3132 Benign neoplasm of left choroid: Secondary | ICD-10-CM

## 2016-04-08 DIAGNOSIS — H2513 Age-related nuclear cataract, bilateral: Secondary | ICD-10-CM | POA: Diagnosis not present

## 2016-04-08 DIAGNOSIS — H43813 Vitreous degeneration, bilateral: Secondary | ICD-10-CM

## 2016-04-10 DIAGNOSIS — M9903 Segmental and somatic dysfunction of lumbar region: Secondary | ICD-10-CM | POA: Diagnosis not present

## 2016-04-10 DIAGNOSIS — S338XXA Sprain of other parts of lumbar spine and pelvis, initial encounter: Secondary | ICD-10-CM | POA: Diagnosis not present

## 2016-04-10 DIAGNOSIS — S8392XA Sprain of unspecified site of left knee, initial encounter: Secondary | ICD-10-CM | POA: Diagnosis not present

## 2016-04-11 DIAGNOSIS — S338XXA Sprain of other parts of lumbar spine and pelvis, initial encounter: Secondary | ICD-10-CM | POA: Diagnosis not present

## 2016-04-11 DIAGNOSIS — S8392XA Sprain of unspecified site of left knee, initial encounter: Secondary | ICD-10-CM | POA: Diagnosis not present

## 2016-04-11 DIAGNOSIS — M9903 Segmental and somatic dysfunction of lumbar region: Secondary | ICD-10-CM | POA: Diagnosis not present

## 2016-04-15 DIAGNOSIS — S8392XA Sprain of unspecified site of left knee, initial encounter: Secondary | ICD-10-CM | POA: Diagnosis not present

## 2016-04-15 DIAGNOSIS — M9903 Segmental and somatic dysfunction of lumbar region: Secondary | ICD-10-CM | POA: Diagnosis not present

## 2016-04-15 DIAGNOSIS — S338XXA Sprain of other parts of lumbar spine and pelvis, initial encounter: Secondary | ICD-10-CM | POA: Diagnosis not present

## 2016-04-22 DIAGNOSIS — S8392XA Sprain of unspecified site of left knee, initial encounter: Secondary | ICD-10-CM | POA: Diagnosis not present

## 2016-05-20 DIAGNOSIS — S8392XA Sprain of unspecified site of left knee, initial encounter: Secondary | ICD-10-CM | POA: Diagnosis not present

## 2016-05-24 DIAGNOSIS — S83282A Other tear of lateral meniscus, current injury, left knee, initial encounter: Secondary | ICD-10-CM | POA: Diagnosis not present

## 2016-05-24 DIAGNOSIS — S83242A Other tear of medial meniscus, current injury, left knee, initial encounter: Secondary | ICD-10-CM | POA: Diagnosis not present

## 2016-05-24 DIAGNOSIS — M948X6 Other specified disorders of cartilage, lower leg: Secondary | ICD-10-CM | POA: Diagnosis not present

## 2016-05-28 DIAGNOSIS — S83232A Complex tear of medial meniscus, current injury, left knee, initial encounter: Secondary | ICD-10-CM | POA: Diagnosis not present

## 2016-05-28 DIAGNOSIS — S83272A Complex tear of lateral meniscus, current injury, left knee, initial encounter: Secondary | ICD-10-CM | POA: Diagnosis not present

## 2016-06-20 DIAGNOSIS — M94262 Chondromalacia, left knee: Secondary | ICD-10-CM | POA: Diagnosis not present

## 2016-06-20 DIAGNOSIS — M1990 Unspecified osteoarthritis, unspecified site: Secondary | ICD-10-CM | POA: Diagnosis not present

## 2016-06-20 DIAGNOSIS — S83272A Complex tear of lateral meniscus, current injury, left knee, initial encounter: Secondary | ICD-10-CM | POA: Diagnosis not present

## 2016-06-20 DIAGNOSIS — Z886 Allergy status to analgesic agent status: Secondary | ICD-10-CM | POA: Diagnosis not present

## 2016-06-20 DIAGNOSIS — S83232A Complex tear of medial meniscus, current injury, left knee, initial encounter: Secondary | ICD-10-CM | POA: Diagnosis not present

## 2016-06-20 DIAGNOSIS — G4700 Insomnia, unspecified: Secondary | ICD-10-CM | POA: Diagnosis not present

## 2016-06-20 DIAGNOSIS — Z883 Allergy status to other anti-infective agents status: Secondary | ICD-10-CM | POA: Diagnosis not present

## 2016-06-20 DIAGNOSIS — Z9049 Acquired absence of other specified parts of digestive tract: Secondary | ICD-10-CM | POA: Diagnosis not present

## 2016-06-21 DIAGNOSIS — Z883 Allergy status to other anti-infective agents status: Secondary | ICD-10-CM | POA: Diagnosis not present

## 2016-06-21 DIAGNOSIS — S83272A Complex tear of lateral meniscus, current injury, left knee, initial encounter: Secondary | ICD-10-CM | POA: Diagnosis not present

## 2016-06-21 DIAGNOSIS — Z9049 Acquired absence of other specified parts of digestive tract: Secondary | ICD-10-CM | POA: Diagnosis not present

## 2016-06-21 DIAGNOSIS — S83232A Complex tear of medial meniscus, current injury, left knee, initial encounter: Secondary | ICD-10-CM | POA: Diagnosis not present

## 2016-06-21 DIAGNOSIS — S83242A Other tear of medial meniscus, current injury, left knee, initial encounter: Secondary | ICD-10-CM | POA: Diagnosis not present

## 2016-06-21 DIAGNOSIS — M1990 Unspecified osteoarthritis, unspecified site: Secondary | ICD-10-CM | POA: Diagnosis not present

## 2016-06-21 DIAGNOSIS — X58XXXA Exposure to other specified factors, initial encounter: Secondary | ICD-10-CM | POA: Diagnosis not present

## 2016-06-21 DIAGNOSIS — S83282A Other tear of lateral meniscus, current injury, left knee, initial encounter: Secondary | ICD-10-CM | POA: Diagnosis not present

## 2016-06-21 DIAGNOSIS — Z886 Allergy status to analgesic agent status: Secondary | ICD-10-CM | POA: Diagnosis not present

## 2016-06-21 DIAGNOSIS — G4700 Insomnia, unspecified: Secondary | ICD-10-CM | POA: Diagnosis not present

## 2016-06-21 DIAGNOSIS — M94262 Chondromalacia, left knee: Secondary | ICD-10-CM | POA: Diagnosis not present

## 2016-07-03 DIAGNOSIS — M25562 Pain in left knee: Secondary | ICD-10-CM | POA: Diagnosis not present

## 2016-07-03 DIAGNOSIS — Z9889 Other specified postprocedural states: Secondary | ICD-10-CM | POA: Diagnosis not present

## 2016-07-05 DIAGNOSIS — M25562 Pain in left knee: Secondary | ICD-10-CM | POA: Diagnosis not present

## 2016-07-05 DIAGNOSIS — Z9889 Other specified postprocedural states: Secondary | ICD-10-CM | POA: Diagnosis not present

## 2016-07-08 DIAGNOSIS — Z9889 Other specified postprocedural states: Secondary | ICD-10-CM | POA: Diagnosis not present

## 2016-07-08 DIAGNOSIS — M25562 Pain in left knee: Secondary | ICD-10-CM | POA: Diagnosis not present

## 2016-07-09 ENCOUNTER — Encounter (INDEPENDENT_AMBULATORY_CARE_PROVIDER_SITE_OTHER): Payer: Medicare Other | Admitting: Ophthalmology

## 2016-07-09 DIAGNOSIS — H2513 Age-related nuclear cataract, bilateral: Secondary | ICD-10-CM | POA: Diagnosis not present

## 2016-07-09 DIAGNOSIS — D3132 Benign neoplasm of left choroid: Secondary | ICD-10-CM | POA: Diagnosis not present

## 2016-07-09 DIAGNOSIS — H43813 Vitreous degeneration, bilateral: Secondary | ICD-10-CM

## 2016-07-11 DIAGNOSIS — Z9889 Other specified postprocedural states: Secondary | ICD-10-CM | POA: Diagnosis not present

## 2016-07-11 DIAGNOSIS — M25562 Pain in left knee: Secondary | ICD-10-CM | POA: Diagnosis not present

## 2016-07-12 DIAGNOSIS — Z23 Encounter for immunization: Secondary | ICD-10-CM | POA: Diagnosis not present

## 2016-07-15 DIAGNOSIS — M25562 Pain in left knee: Secondary | ICD-10-CM | POA: Diagnosis not present

## 2016-07-15 DIAGNOSIS — Z9889 Other specified postprocedural states: Secondary | ICD-10-CM | POA: Diagnosis not present

## 2016-07-18 DIAGNOSIS — M25562 Pain in left knee: Secondary | ICD-10-CM | POA: Diagnosis not present

## 2016-07-18 DIAGNOSIS — Z9889 Other specified postprocedural states: Secondary | ICD-10-CM | POA: Diagnosis not present

## 2016-07-22 DIAGNOSIS — Z9889 Other specified postprocedural states: Secondary | ICD-10-CM | POA: Diagnosis not present

## 2016-07-22 DIAGNOSIS — M25562 Pain in left knee: Secondary | ICD-10-CM | POA: Diagnosis not present

## 2016-07-25 DIAGNOSIS — Z9889 Other specified postprocedural states: Secondary | ICD-10-CM | POA: Diagnosis not present

## 2016-07-25 DIAGNOSIS — M25562 Pain in left knee: Secondary | ICD-10-CM | POA: Diagnosis not present

## 2016-08-13 DIAGNOSIS — M13162 Monoarthritis, not elsewhere classified, left knee: Secondary | ICD-10-CM | POA: Diagnosis not present

## 2016-08-13 DIAGNOSIS — M25562 Pain in left knee: Secondary | ICD-10-CM | POA: Diagnosis not present

## 2016-08-28 DIAGNOSIS — Z85828 Personal history of other malignant neoplasm of skin: Secondary | ICD-10-CM | POA: Diagnosis not present

## 2016-08-28 DIAGNOSIS — L570 Actinic keratosis: Secondary | ICD-10-CM | POA: Diagnosis not present

## 2016-09-10 DIAGNOSIS — G4700 Insomnia, unspecified: Secondary | ICD-10-CM | POA: Diagnosis not present

## 2016-09-10 DIAGNOSIS — Z299 Encounter for prophylactic measures, unspecified: Secondary | ICD-10-CM | POA: Diagnosis not present

## 2016-09-10 DIAGNOSIS — M545 Low back pain: Secondary | ICD-10-CM | POA: Diagnosis not present

## 2016-09-10 DIAGNOSIS — Z6825 Body mass index (BMI) 25.0-25.9, adult: Secondary | ICD-10-CM | POA: Diagnosis not present

## 2016-09-10 DIAGNOSIS — Z713 Dietary counseling and surveillance: Secondary | ICD-10-CM | POA: Diagnosis not present

## 2016-09-24 DIAGNOSIS — R5383 Other fatigue: Secondary | ICD-10-CM | POA: Diagnosis not present

## 2016-09-24 DIAGNOSIS — Z7189 Other specified counseling: Secondary | ICD-10-CM | POA: Diagnosis not present

## 2016-09-24 DIAGNOSIS — Z6826 Body mass index (BMI) 26.0-26.9, adult: Secondary | ICD-10-CM | POA: Diagnosis not present

## 2016-09-24 DIAGNOSIS — Z79899 Other long term (current) drug therapy: Secondary | ICD-10-CM | POA: Diagnosis not present

## 2016-09-24 DIAGNOSIS — Z1389 Encounter for screening for other disorder: Secondary | ICD-10-CM | POA: Diagnosis not present

## 2016-09-24 DIAGNOSIS — Z0000 Encounter for general adult medical examination without abnormal findings: Secondary | ICD-10-CM | POA: Diagnosis not present

## 2016-09-24 DIAGNOSIS — Z1211 Encounter for screening for malignant neoplasm of colon: Secondary | ICD-10-CM | POA: Diagnosis not present

## 2016-09-24 DIAGNOSIS — Z125 Encounter for screening for malignant neoplasm of prostate: Secondary | ICD-10-CM | POA: Diagnosis not present

## 2016-09-24 DIAGNOSIS — Z299 Encounter for prophylactic measures, unspecified: Secondary | ICD-10-CM | POA: Diagnosis not present

## 2016-09-24 DIAGNOSIS — Z789 Other specified health status: Secondary | ICD-10-CM | POA: Diagnosis not present

## 2016-10-02 DIAGNOSIS — M1712 Unilateral primary osteoarthritis, left knee: Secondary | ICD-10-CM | POA: Diagnosis not present

## 2016-10-02 DIAGNOSIS — M13162 Monoarthritis, not elsewhere classified, left knee: Secondary | ICD-10-CM | POA: Diagnosis not present

## 2016-10-02 DIAGNOSIS — M25562 Pain in left knee: Secondary | ICD-10-CM | POA: Diagnosis not present

## 2016-10-02 DIAGNOSIS — M94262 Chondromalacia, left knee: Secondary | ICD-10-CM | POA: Diagnosis not present

## 2016-10-08 DIAGNOSIS — M25562 Pain in left knee: Secondary | ICD-10-CM | POA: Diagnosis not present

## 2016-10-08 DIAGNOSIS — M25462 Effusion, left knee: Secondary | ICD-10-CM | POA: Diagnosis not present

## 2016-10-08 DIAGNOSIS — M25662 Stiffness of left knee, not elsewhere classified: Secondary | ICD-10-CM | POA: Diagnosis not present

## 2016-10-10 DIAGNOSIS — M25562 Pain in left knee: Secondary | ICD-10-CM | POA: Diagnosis not present

## 2016-10-10 DIAGNOSIS — M25662 Stiffness of left knee, not elsewhere classified: Secondary | ICD-10-CM | POA: Diagnosis not present

## 2016-10-10 DIAGNOSIS — M25462 Effusion, left knee: Secondary | ICD-10-CM | POA: Diagnosis not present

## 2016-10-15 DIAGNOSIS — M25662 Stiffness of left knee, not elsewhere classified: Secondary | ICD-10-CM | POA: Diagnosis not present

## 2016-10-15 DIAGNOSIS — M25462 Effusion, left knee: Secondary | ICD-10-CM | POA: Diagnosis not present

## 2016-10-15 DIAGNOSIS — M25562 Pain in left knee: Secondary | ICD-10-CM | POA: Diagnosis not present

## 2016-10-22 DIAGNOSIS — M25562 Pain in left knee: Secondary | ICD-10-CM | POA: Diagnosis not present

## 2016-10-22 DIAGNOSIS — M25662 Stiffness of left knee, not elsewhere classified: Secondary | ICD-10-CM | POA: Diagnosis not present

## 2016-10-22 DIAGNOSIS — M25462 Effusion, left knee: Secondary | ICD-10-CM | POA: Diagnosis not present

## 2016-10-24 DIAGNOSIS — M25562 Pain in left knee: Secondary | ICD-10-CM | POA: Diagnosis not present

## 2016-10-24 DIAGNOSIS — M25662 Stiffness of left knee, not elsewhere classified: Secondary | ICD-10-CM | POA: Diagnosis not present

## 2016-10-24 DIAGNOSIS — M25462 Effusion, left knee: Secondary | ICD-10-CM | POA: Diagnosis not present

## 2016-11-20 DIAGNOSIS — Z713 Dietary counseling and surveillance: Secondary | ICD-10-CM | POA: Diagnosis not present

## 2016-11-20 DIAGNOSIS — Z789 Other specified health status: Secondary | ICD-10-CM | POA: Diagnosis not present

## 2016-11-20 DIAGNOSIS — M545 Low back pain: Secondary | ICD-10-CM | POA: Diagnosis not present

## 2016-11-20 DIAGNOSIS — Z299 Encounter for prophylactic measures, unspecified: Secondary | ICD-10-CM | POA: Diagnosis not present

## 2016-11-20 DIAGNOSIS — E663 Overweight: Secondary | ICD-10-CM | POA: Diagnosis not present

## 2016-11-20 DIAGNOSIS — J069 Acute upper respiratory infection, unspecified: Secondary | ICD-10-CM | POA: Diagnosis not present

## 2016-11-20 DIAGNOSIS — G4700 Insomnia, unspecified: Secondary | ICD-10-CM | POA: Diagnosis not present

## 2016-12-23 DIAGNOSIS — Z6826 Body mass index (BMI) 26.0-26.9, adult: Secondary | ICD-10-CM | POA: Diagnosis not present

## 2016-12-23 DIAGNOSIS — M545 Low back pain: Secondary | ICD-10-CM | POA: Diagnosis not present

## 2016-12-23 DIAGNOSIS — G4700 Insomnia, unspecified: Secondary | ICD-10-CM | POA: Diagnosis not present

## 2016-12-23 DIAGNOSIS — H6981 Other specified disorders of Eustachian tube, right ear: Secondary | ICD-10-CM | POA: Diagnosis not present

## 2016-12-23 DIAGNOSIS — N529 Male erectile dysfunction, unspecified: Secondary | ICD-10-CM | POA: Diagnosis not present

## 2016-12-23 DIAGNOSIS — Z299 Encounter for prophylactic measures, unspecified: Secondary | ICD-10-CM | POA: Diagnosis not present

## 2016-12-23 DIAGNOSIS — Z713 Dietary counseling and surveillance: Secondary | ICD-10-CM | POA: Diagnosis not present

## 2017-01-16 DIAGNOSIS — Z299 Encounter for prophylactic measures, unspecified: Secondary | ICD-10-CM | POA: Diagnosis not present

## 2017-01-16 DIAGNOSIS — M542 Cervicalgia: Secondary | ICD-10-CM | POA: Diagnosis not present

## 2017-01-16 DIAGNOSIS — M47812 Spondylosis without myelopathy or radiculopathy, cervical region: Secondary | ICD-10-CM | POA: Diagnosis not present

## 2017-01-16 DIAGNOSIS — Z6826 Body mass index (BMI) 26.0-26.9, adult: Secondary | ICD-10-CM | POA: Diagnosis not present

## 2017-01-16 DIAGNOSIS — M9981 Other biomechanical lesions of cervical region: Secondary | ICD-10-CM | POA: Diagnosis not present

## 2017-01-16 DIAGNOSIS — M436 Torticollis: Secondary | ICD-10-CM | POA: Diagnosis not present

## 2017-01-16 DIAGNOSIS — H6981 Other specified disorders of Eustachian tube, right ear: Secondary | ICD-10-CM | POA: Diagnosis not present

## 2017-01-16 DIAGNOSIS — H9313 Tinnitus, bilateral: Secondary | ICD-10-CM | POA: Diagnosis not present

## 2017-01-22 DIAGNOSIS — M13162 Monoarthritis, not elsewhere classified, left knee: Secondary | ICD-10-CM | POA: Diagnosis not present

## 2017-01-22 DIAGNOSIS — M1712 Unilateral primary osteoarthritis, left knee: Secondary | ICD-10-CM | POA: Diagnosis not present

## 2017-01-22 DIAGNOSIS — M25561 Pain in right knee: Secondary | ICD-10-CM | POA: Diagnosis not present

## 2017-01-22 DIAGNOSIS — S83232A Complex tear of medial meniscus, current injury, left knee, initial encounter: Secondary | ICD-10-CM | POA: Diagnosis not present

## 2017-01-23 DIAGNOSIS — M5030 Other cervical disc degeneration, unspecified cervical region: Secondary | ICD-10-CM | POA: Diagnosis not present

## 2017-01-23 DIAGNOSIS — E663 Overweight: Secondary | ICD-10-CM | POA: Diagnosis not present

## 2017-01-23 DIAGNOSIS — M542 Cervicalgia: Secondary | ICD-10-CM | POA: Diagnosis not present

## 2017-01-23 DIAGNOSIS — Z299 Encounter for prophylactic measures, unspecified: Secondary | ICD-10-CM | POA: Diagnosis not present

## 2017-01-23 DIAGNOSIS — H9313 Tinnitus, bilateral: Secondary | ICD-10-CM | POA: Diagnosis not present

## 2017-01-29 DIAGNOSIS — M5021 Other cervical disc displacement,  high cervical region: Secondary | ICD-10-CM | POA: Diagnosis not present

## 2017-01-29 DIAGNOSIS — M47812 Spondylosis without myelopathy or radiculopathy, cervical region: Secondary | ICD-10-CM | POA: Diagnosis not present

## 2017-01-29 DIAGNOSIS — M4312 Spondylolisthesis, cervical region: Secondary | ICD-10-CM | POA: Diagnosis not present

## 2017-01-29 DIAGNOSIS — M47813 Spondylosis without myelopathy or radiculopathy, cervicothoracic region: Secondary | ICD-10-CM | POA: Diagnosis not present

## 2017-02-03 DIAGNOSIS — M5030 Other cervical disc degeneration, unspecified cervical region: Secondary | ICD-10-CM | POA: Diagnosis not present

## 2017-02-03 DIAGNOSIS — M4803 Spinal stenosis, cervicothoracic region: Secondary | ICD-10-CM | POA: Diagnosis not present

## 2017-02-03 DIAGNOSIS — Z299 Encounter for prophylactic measures, unspecified: Secondary | ICD-10-CM | POA: Diagnosis not present

## 2017-02-03 DIAGNOSIS — Z6826 Body mass index (BMI) 26.0-26.9, adult: Secondary | ICD-10-CM | POA: Diagnosis not present

## 2017-02-03 DIAGNOSIS — G4700 Insomnia, unspecified: Secondary | ICD-10-CM | POA: Diagnosis not present

## 2017-02-10 DIAGNOSIS — D3132 Benign neoplasm of left choroid: Secondary | ICD-10-CM | POA: Diagnosis not present

## 2017-04-17 DIAGNOSIS — H2512 Age-related nuclear cataract, left eye: Secondary | ICD-10-CM | POA: Diagnosis not present

## 2017-04-17 DIAGNOSIS — H353122 Nonexudative age-related macular degeneration, left eye, intermediate dry stage: Secondary | ICD-10-CM | POA: Diagnosis not present

## 2017-04-17 DIAGNOSIS — H35361 Drusen (degenerative) of macula, right eye: Secondary | ICD-10-CM | POA: Diagnosis not present

## 2017-04-17 DIAGNOSIS — H2513 Age-related nuclear cataract, bilateral: Secondary | ICD-10-CM | POA: Diagnosis not present

## 2017-04-17 DIAGNOSIS — H40013 Open angle with borderline findings, low risk, bilateral: Secondary | ICD-10-CM | POA: Diagnosis not present

## 2017-04-17 DIAGNOSIS — H25012 Cortical age-related cataract, left eye: Secondary | ICD-10-CM | POA: Diagnosis not present

## 2017-04-17 DIAGNOSIS — H25013 Cortical age-related cataract, bilateral: Secondary | ICD-10-CM | POA: Diagnosis not present

## 2017-04-29 DIAGNOSIS — H25812 Combined forms of age-related cataract, left eye: Secondary | ICD-10-CM | POA: Diagnosis not present

## 2017-04-29 DIAGNOSIS — H2512 Age-related nuclear cataract, left eye: Secondary | ICD-10-CM | POA: Diagnosis not present

## 2017-05-05 DIAGNOSIS — M25562 Pain in left knee: Secondary | ICD-10-CM | POA: Diagnosis not present

## 2017-05-05 DIAGNOSIS — M1712 Unilateral primary osteoarthritis, left knee: Secondary | ICD-10-CM | POA: Diagnosis not present

## 2017-05-05 DIAGNOSIS — M25561 Pain in right knee: Secondary | ICD-10-CM | POA: Diagnosis not present

## 2017-05-19 DIAGNOSIS — H25011 Cortical age-related cataract, right eye: Secondary | ICD-10-CM | POA: Diagnosis not present

## 2017-05-19 DIAGNOSIS — H2511 Age-related nuclear cataract, right eye: Secondary | ICD-10-CM | POA: Diagnosis not present

## 2017-05-27 DIAGNOSIS — H2511 Age-related nuclear cataract, right eye: Secondary | ICD-10-CM | POA: Diagnosis not present

## 2017-05-27 DIAGNOSIS — H25811 Combined forms of age-related cataract, right eye: Secondary | ICD-10-CM | POA: Diagnosis not present

## 2017-06-19 DIAGNOSIS — Z6826 Body mass index (BMI) 26.0-26.9, adult: Secondary | ICD-10-CM | POA: Diagnosis not present

## 2017-06-19 DIAGNOSIS — G4700 Insomnia, unspecified: Secondary | ICD-10-CM | POA: Diagnosis not present

## 2017-06-19 DIAGNOSIS — M545 Low back pain: Secondary | ICD-10-CM | POA: Diagnosis not present

## 2017-06-19 DIAGNOSIS — Z299 Encounter for prophylactic measures, unspecified: Secondary | ICD-10-CM | POA: Diagnosis not present

## 2017-06-19 DIAGNOSIS — L0231 Cutaneous abscess of buttock: Secondary | ICD-10-CM | POA: Diagnosis not present

## 2017-06-25 DIAGNOSIS — L0231 Cutaneous abscess of buttock: Secondary | ICD-10-CM | POA: Diagnosis not present

## 2017-06-25 DIAGNOSIS — Z6826 Body mass index (BMI) 26.0-26.9, adult: Secondary | ICD-10-CM | POA: Diagnosis not present

## 2017-06-25 DIAGNOSIS — Z713 Dietary counseling and surveillance: Secondary | ICD-10-CM | POA: Diagnosis not present

## 2017-06-25 DIAGNOSIS — Z299 Encounter for prophylactic measures, unspecified: Secondary | ICD-10-CM | POA: Diagnosis not present

## 2017-08-08 ENCOUNTER — Ambulatory Visit (INDEPENDENT_AMBULATORY_CARE_PROVIDER_SITE_OTHER): Payer: Medicare Other | Admitting: Podiatry

## 2017-08-08 ENCOUNTER — Encounter: Payer: Self-pay | Admitting: Podiatry

## 2017-08-08 ENCOUNTER — Ambulatory Visit (INDEPENDENT_AMBULATORY_CARE_PROVIDER_SITE_OTHER): Payer: Medicare Other

## 2017-08-08 DIAGNOSIS — M722 Plantar fascial fibromatosis: Secondary | ICD-10-CM

## 2017-08-08 MED ORDER — TRIAMCINOLONE ACETONIDE 10 MG/ML IJ SUSP
10.0000 mg | Freq: Once | INTRAMUSCULAR | Status: AC
  Administered 2017-08-08: 10 mg

## 2017-08-08 NOTE — Patient Instructions (Signed)

## 2017-08-08 NOTE — Progress Notes (Signed)
   Subjective:    Patient ID: Kurt Duke, male    DOB: 09/01/1946, 71 y.o.   MRN: [id number]  HPI Chief Complaint  Patient presents with  . Foot Pain    Plantar heel and medial foot left - aching x 2 months, AM pain, swelling some, active in pickle ball, tried ice, heat, tramadol, Ibuprofen, Tylenol, and stretching-some relief      Review of Systems  All other systems reviewed and are negative.      Objective:   Physical Exam        Assessment & Plan:

## 2017-08-11 NOTE — Progress Notes (Signed)
Subjective:    Patient ID: Kurt Duke, male   DOB: 71 y.o.   MRN: [id number]   HPI patient states she's had a worsening of pain in the plantar aspect of the left heel over the last few months. Patient states that it's making it hard for him to be active and that he likes to play sports but is been unable and also patient states that he does not smoke    Review of Systems  All other systems reviewed and are negative.       Objective:  Physical Exam  Constitutional: He appears well-developed and well-nourished.  Cardiovascular: Intact distal pulses.  Pulmonary/Chest: Effort normal.  Musculoskeletal: Normal range of motion.  Neurological: He is alert.  Skin: Skin is warm.  Nursing note and vitals reviewed.  neurovascular status intact muscle strength adequate range of motion within normal limits with patient noted to have exquisite discomfort in the plantar aspect of the left heel at the insertional point of the tendon into the calcaneus with inflammation fluid around the medial band. Patient's found have good digital perfusion and is well oriented 3 with mild discomfort of the heel bone itself     Assessment:    Acute plantar fasciitis left with inflammation fluid around the medial band     Plan:  H&P condition reviewed at great length. At this point I did go ahead and injected the left plantar fascia 3 mg Kenalog 5 mg Xylocaine after reviewing x-rays and I placed into a fascial brace with instructions on usage. Patient will be seen back in the next several weeks and is instructed on shoe gear modifications  X-rays indicate that there is small spur but I did not see signs currently of stress fracture or other pathology

## 2017-08-12 DIAGNOSIS — Z23 Encounter for immunization: Secondary | ICD-10-CM | POA: Diagnosis not present

## 2017-08-27 DIAGNOSIS — L570 Actinic keratosis: Secondary | ICD-10-CM | POA: Diagnosis not present

## 2017-08-27 DIAGNOSIS — Z85828 Personal history of other malignant neoplasm of skin: Secondary | ICD-10-CM | POA: Diagnosis not present

## 2017-08-27 DIAGNOSIS — L821 Other seborrheic keratosis: Secondary | ICD-10-CM | POA: Diagnosis not present

## 2017-08-29 ENCOUNTER — Encounter: Payer: Self-pay | Admitting: Podiatry

## 2017-08-29 ENCOUNTER — Ambulatory Visit (INDEPENDENT_AMBULATORY_CARE_PROVIDER_SITE_OTHER): Payer: Medicare Other | Admitting: Podiatry

## 2017-08-29 DIAGNOSIS — M722 Plantar fascial fibromatosis: Secondary | ICD-10-CM | POA: Diagnosis not present

## 2017-08-29 MED ORDER — TRIAMCINOLONE ACETONIDE 10 MG/ML IJ SUSP
10.0000 mg | Freq: Once | INTRAMUSCULAR | Status: AC
  Administered 2017-08-29: 10 mg

## 2017-08-29 NOTE — Progress Notes (Signed)
Subjective:   Patient ID: Kurt Duke, male   DOB: 71 y.o.   MRN: [id number]   HPI Patient presents stating I am doing well but I am still getting some pain on the outside of my heel   ROS      Objective:  Physical Exam  Neurovascular status intact with the medial heel left doing very well but pain in the lateral anterior aspect of the heel     Assessment:  Plantar fasciitis left medial doing well with pain in the outside of the left heel     Plan:  Injected the plantar lateral heel 3 mg Kenalog 5 mg Xylocaine gave instructions of physical therapy and reappoint to recheck.

## 2017-09-25 DIAGNOSIS — Z7189 Other specified counseling: Secondary | ICD-10-CM | POA: Diagnosis not present

## 2017-09-25 DIAGNOSIS — Z125 Encounter for screening for malignant neoplasm of prostate: Secondary | ICD-10-CM | POA: Diagnosis not present

## 2017-09-25 DIAGNOSIS — M545 Low back pain: Secondary | ICD-10-CM | POA: Diagnosis not present

## 2017-09-25 DIAGNOSIS — R5383 Other fatigue: Secondary | ICD-10-CM | POA: Diagnosis not present

## 2017-09-25 DIAGNOSIS — Z79899 Other long term (current) drug therapy: Secondary | ICD-10-CM | POA: Diagnosis not present

## 2017-09-25 DIAGNOSIS — Z299 Encounter for prophylactic measures, unspecified: Secondary | ICD-10-CM | POA: Diagnosis not present

## 2017-09-25 DIAGNOSIS — G4700 Insomnia, unspecified: Secondary | ICD-10-CM | POA: Diagnosis not present

## 2017-09-25 DIAGNOSIS — Z1211 Encounter for screening for malignant neoplasm of colon: Secondary | ICD-10-CM | POA: Diagnosis not present

## 2017-09-25 DIAGNOSIS — Z1331 Encounter for screening for depression: Secondary | ICD-10-CM | POA: Diagnosis not present

## 2017-09-25 DIAGNOSIS — Z1339 Encounter for screening examination for other mental health and behavioral disorders: Secondary | ICD-10-CM | POA: Diagnosis not present

## 2017-09-25 DIAGNOSIS — Z6826 Body mass index (BMI) 26.0-26.9, adult: Secondary | ICD-10-CM | POA: Diagnosis not present

## 2017-09-25 DIAGNOSIS — Z0000 Encounter for general adult medical examination without abnormal findings: Secondary | ICD-10-CM | POA: Diagnosis not present

## 2017-10-20 DIAGNOSIS — R972 Elevated prostate specific antigen [PSA]: Secondary | ICD-10-CM | POA: Diagnosis not present

## 2018-01-16 DIAGNOSIS — M170 Bilateral primary osteoarthritis of knee: Secondary | ICD-10-CM | POA: Diagnosis not present

## 2018-01-16 DIAGNOSIS — Z9889 Other specified postprocedural states: Secondary | ICD-10-CM | POA: Diagnosis not present

## 2018-01-16 DIAGNOSIS — M25562 Pain in left knee: Secondary | ICD-10-CM | POA: Diagnosis not present

## 2018-01-16 DIAGNOSIS — M174 Other bilateral secondary osteoarthritis of knee: Secondary | ICD-10-CM | POA: Diagnosis not present

## 2018-01-16 DIAGNOSIS — M25561 Pain in right knee: Secondary | ICD-10-CM | POA: Diagnosis not present

## 2018-03-19 DIAGNOSIS — H43813 Vitreous degeneration, bilateral: Secondary | ICD-10-CM | POA: Diagnosis not present

## 2018-05-08 DIAGNOSIS — Z299 Encounter for prophylactic measures, unspecified: Secondary | ICD-10-CM | POA: Diagnosis not present

## 2018-05-08 DIAGNOSIS — M545 Low back pain: Secondary | ICD-10-CM | POA: Diagnosis not present

## 2018-05-08 DIAGNOSIS — Z6826 Body mass index (BMI) 26.0-26.9, adult: Secondary | ICD-10-CM | POA: Diagnosis not present

## 2018-05-08 DIAGNOSIS — Z713 Dietary counseling and surveillance: Secondary | ICD-10-CM | POA: Diagnosis not present

## 2018-05-08 DIAGNOSIS — G4700 Insomnia, unspecified: Secondary | ICD-10-CM | POA: Diagnosis not present

## 2018-05-08 DIAGNOSIS — Z79899 Other long term (current) drug therapy: Secondary | ICD-10-CM | POA: Diagnosis not present

## 2018-05-20 DIAGNOSIS — M170 Bilateral primary osteoarthritis of knee: Secondary | ICD-10-CM | POA: Diagnosis not present

## 2018-06-03 DIAGNOSIS — M779 Enthesopathy, unspecified: Secondary | ICD-10-CM | POA: Diagnosis not present

## 2018-06-03 DIAGNOSIS — M545 Low back pain: Secondary | ICD-10-CM | POA: Diagnosis not present

## 2018-06-03 DIAGNOSIS — M25511 Pain in right shoulder: Secondary | ICD-10-CM | POA: Diagnosis not present

## 2018-06-03 DIAGNOSIS — Z299 Encounter for prophylactic measures, unspecified: Secondary | ICD-10-CM | POA: Diagnosis not present

## 2018-06-03 DIAGNOSIS — Z6826 Body mass index (BMI) 26.0-26.9, adult: Secondary | ICD-10-CM | POA: Diagnosis not present

## 2018-07-13 DIAGNOSIS — M170 Bilateral primary osteoarthritis of knee: Secondary | ICD-10-CM | POA: Diagnosis not present

## 2018-08-04 DIAGNOSIS — Z23 Encounter for immunization: Secondary | ICD-10-CM | POA: Diagnosis not present

## 2018-09-01 DIAGNOSIS — D1801 Hemangioma of skin and subcutaneous tissue: Secondary | ICD-10-CM | POA: Diagnosis not present

## 2018-09-01 DIAGNOSIS — L820 Inflamed seborrheic keratosis: Secondary | ICD-10-CM | POA: Diagnosis not present

## 2018-09-01 DIAGNOSIS — Z85828 Personal history of other malignant neoplasm of skin: Secondary | ICD-10-CM | POA: Diagnosis not present

## 2018-09-01 DIAGNOSIS — D485 Neoplasm of uncertain behavior of skin: Secondary | ICD-10-CM | POA: Diagnosis not present

## 2018-09-01 DIAGNOSIS — L570 Actinic keratosis: Secondary | ICD-10-CM | POA: Diagnosis not present

## 2018-09-28 DIAGNOSIS — Z125 Encounter for screening for malignant neoplasm of prostate: Secondary | ICD-10-CM | POA: Diagnosis not present

## 2018-09-28 DIAGNOSIS — Z1339 Encounter for screening examination for other mental health and behavioral disorders: Secondary | ICD-10-CM | POA: Diagnosis not present

## 2018-09-28 DIAGNOSIS — Z1211 Encounter for screening for malignant neoplasm of colon: Secondary | ICD-10-CM | POA: Diagnosis not present

## 2018-09-28 DIAGNOSIS — Z0000 Encounter for general adult medical examination without abnormal findings: Secondary | ICD-10-CM | POA: Diagnosis not present

## 2018-09-28 DIAGNOSIS — R5383 Other fatigue: Secondary | ICD-10-CM | POA: Diagnosis not present

## 2018-09-28 DIAGNOSIS — E7800 Pure hypercholesterolemia, unspecified: Secondary | ICD-10-CM | POA: Diagnosis not present

## 2018-09-28 DIAGNOSIS — Z299 Encounter for prophylactic measures, unspecified: Secondary | ICD-10-CM | POA: Diagnosis not present

## 2018-09-28 DIAGNOSIS — M19049 Primary osteoarthritis, unspecified hand: Secondary | ICD-10-CM | POA: Diagnosis not present

## 2018-09-28 DIAGNOSIS — Z1331 Encounter for screening for depression: Secondary | ICD-10-CM | POA: Diagnosis not present

## 2018-09-28 DIAGNOSIS — G4700 Insomnia, unspecified: Secondary | ICD-10-CM | POA: Diagnosis not present

## 2018-09-28 DIAGNOSIS — Z6826 Body mass index (BMI) 26.0-26.9, adult: Secondary | ICD-10-CM | POA: Diagnosis not present

## 2018-09-28 DIAGNOSIS — M545 Low back pain: Secondary | ICD-10-CM | POA: Diagnosis not present

## 2018-09-28 DIAGNOSIS — Z79899 Other long term (current) drug therapy: Secondary | ICD-10-CM | POA: Diagnosis not present

## 2018-09-28 DIAGNOSIS — Z7189 Other specified counseling: Secondary | ICD-10-CM | POA: Diagnosis not present

## 2018-09-29 ENCOUNTER — Encounter (INDEPENDENT_AMBULATORY_CARE_PROVIDER_SITE_OTHER): Payer: Self-pay | Admitting: *Deleted

## 2018-11-11 ENCOUNTER — Other Ambulatory Visit (INDEPENDENT_AMBULATORY_CARE_PROVIDER_SITE_OTHER): Payer: Self-pay | Admitting: *Deleted

## 2018-11-11 DIAGNOSIS — Z1211 Encounter for screening for malignant neoplasm of colon: Secondary | ICD-10-CM | POA: Insufficient documentation

## 2019-02-03 ENCOUNTER — Telehealth (INDEPENDENT_AMBULATORY_CARE_PROVIDER_SITE_OTHER): Payer: Self-pay | Admitting: *Deleted

## 2019-02-03 ENCOUNTER — Encounter (INDEPENDENT_AMBULATORY_CARE_PROVIDER_SITE_OTHER): Payer: Self-pay | Admitting: *Deleted

## 2019-02-03 MED ORDER — PEG 3350-KCL-NA BICARB-NACL 420 G PO SOLR: 4000.0000 mL | Freq: Once | ORAL | 0 refills | Status: AC

## 2019-02-03 NOTE — Telephone Encounter (Signed)
Patient needs trilyte 

## 2019-03-01 ENCOUNTER — Telehealth (INDEPENDENT_AMBULATORY_CARE_PROVIDER_SITE_OTHER): Payer: Self-pay | Admitting: *Deleted

## 2019-03-01 NOTE — Telephone Encounter (Signed)
Referring MD/PCP: vyas   Procedure: tcs  Reason/Indication:  screening  Has patient had this procedure before?  Yes, 2009  If so, when, by whom and where?    Is there a family history of colon cancer?  no  Who?  What age when diagnosed?    Is patient diabetic?   no      Does patient have prosthetic heart valve or mechanical valve?  no  Do you have a pacemaker?  no  Has patient ever had endocarditis? no  Has patient had joint replacement within last 12 months?  no  Is patient constipated or do they take laxatives? no  Does patient have a history of alcohol/drug use?  no  Is patient on blood thinner such as Coumadin, Plavix and/or Aspirin? no  Medications: tramadol 50 mg daily, tylenol daily, diclofenac 75 mg daily, zolpidem 10 mg daily  Allergies: see epic  Medication Adjustment per Dr Lindi Adie, NP:   Procedure date & time: 03/10/19 at 1030

## 2019-03-01 NOTE — Telephone Encounter (Signed)
agree

## 2019-03-05 ENCOUNTER — Other Ambulatory Visit (HOSPITAL_COMMUNITY)
Admission: RE | Admit: 2019-03-05 | Discharge: 2019-03-05 | Disposition: A | Discharge status: Home / Self Care | Payer: Medicare Other | Source: Ambulatory Visit | Attending: Internal Medicine | Admitting: Internal Medicine

## 2019-03-05 ENCOUNTER — Other Ambulatory Visit: Payer: Self-pay

## 2019-03-05 DIAGNOSIS — Z1159 Encounter for screening for other viral diseases: Secondary | ICD-10-CM | POA: Insufficient documentation

## 2019-03-05 DIAGNOSIS — Z01812 Encounter for preprocedural laboratory examination: Secondary | ICD-10-CM | POA: Insufficient documentation

## 2019-03-06 LAB — NOVEL CORONAVIRUS, NAA (HOSP ORDER, SEND-OUT TO REF LAB; TAT 18-24 HRS): SARS-CoV-2, NAA: NOT DETECTED

## 2019-03-10 ENCOUNTER — Other Ambulatory Visit: Payer: Self-pay

## 2019-03-10 ENCOUNTER — Ambulatory Visit (HOSPITAL_COMMUNITY)
Admission: RE | Admit: 2019-03-10 | Discharge: 2019-03-10 | Disposition: A | Discharge status: Home / Self Care | Payer: Medicare Other | Attending: Internal Medicine | Admitting: Internal Medicine

## 2019-03-10 ENCOUNTER — Encounter (HOSPITAL_COMMUNITY)
Admission: RE | Disposition: A | Discharge status: Home / Self Care | Payer: Self-pay | Source: Home / Self Care | Attending: Internal Medicine

## 2019-03-10 ENCOUNTER — Encounter (HOSPITAL_COMMUNITY): Payer: Self-pay

## 2019-03-10 DIAGNOSIS — K648 Other hemorrhoids: Secondary | ICD-10-CM | POA: Insufficient documentation

## 2019-03-10 DIAGNOSIS — D125 Benign neoplasm of sigmoid colon: Secondary | ICD-10-CM | POA: Diagnosis not present

## 2019-03-10 DIAGNOSIS — Q438 Other specified congenital malformations of intestine: Secondary | ICD-10-CM | POA: Insufficient documentation

## 2019-03-10 DIAGNOSIS — M545 Low back pain: Secondary | ICD-10-CM | POA: Diagnosis not present

## 2019-03-10 DIAGNOSIS — G4700 Insomnia, unspecified: Secondary | ICD-10-CM | POA: Insufficient documentation

## 2019-03-10 DIAGNOSIS — K6389 Other specified diseases of intestine: Secondary | ICD-10-CM | POA: Insufficient documentation

## 2019-03-10 DIAGNOSIS — K5730 Diverticulosis of large intestine without perforation or abscess without bleeding: Secondary | ICD-10-CM

## 2019-03-10 DIAGNOSIS — K644 Residual hemorrhoidal skin tags: Secondary | ICD-10-CM | POA: Insufficient documentation

## 2019-03-10 DIAGNOSIS — Z1211 Encounter for screening for malignant neoplasm of colon: Secondary | ICD-10-CM | POA: Insufficient documentation

## 2019-03-10 DIAGNOSIS — Z79899 Other long term (current) drug therapy: Secondary | ICD-10-CM | POA: Insufficient documentation

## 2019-03-10 HISTORY — PX: POLYPECTOMY: SHX5525

## 2019-03-10 HISTORY — PX: COLONOSCOPY: SHX5424

## 2019-03-10 SURGERY — COLONOSCOPY
Anesthesia: Moderate Sedation

## 2019-03-10 MED ORDER — MIDAZOLAM HCL 5 MG/5ML IJ SOLN
INTRAMUSCULAR | Status: AC
  Filled 2019-03-10: qty 10

## 2019-03-10 MED ORDER — MIDAZOLAM HCL 5 MG/5ML IJ SOLN
INTRAMUSCULAR | Status: DC | PRN
  Administered 2019-03-10: 2 mg via INTRAVENOUS
  Administered 2019-03-10 (×2): 1 mg via INTRAVENOUS
  Administered 2019-03-10 (×2): 2 mg via INTRAVENOUS

## 2019-03-10 MED ORDER — SODIUM CHLORIDE 0.9 % IV SOLN
INTRAVENOUS | Status: DC
  Administered 2019-03-10: 10:00:00 via INTRAVENOUS

## 2019-03-10 MED ORDER — MEPERIDINE HCL 50 MG/ML IJ SOLN
INTRAMUSCULAR | Status: DC | PRN
  Administered 2019-03-10 (×2): 25 mg via INTRAVENOUS

## 2019-03-10 MED ORDER — MEPERIDINE HCL 50 MG/ML IJ SOLN
INTRAMUSCULAR | Status: AC
  Filled 2019-03-10: qty 1

## 2019-03-10 NOTE — Op Note (Signed)
[facility] Patient Name: Kurt Duke Procedure Date: 03/10/2019 9:41 AM MRN: [id number] Date of Birth: [birth year] Attending MD: Hildred Laser , MD CSN: [id number] Age: 73 Admit Type: Outpatient Procedure:                Colonoscopy Indications:              Screening for colorectal malignant neoplasm Providers:                Hildred Laser, MD, Charlsie Quest. Theda Sers RN, RN, Aram Candela Referring MD:             Glenda Chroman, MD Medicines:                Meperidine 50 mg IV, Midazolam 8 mg IV Complications:            No immediate complications. Estimated Blood Loss:     Estimated blood loss was minimal. Procedure:                Pre-Anesthesia Assessment:                           - Prior to the procedure, a History and Physical                            was performed, and patient medications and                            allergies were reviewed. The patient's tolerance of                            previous anesthesia was also reviewed. The risks                            and benefits of the procedure and the sedation                            options and risks were discussed with the patient.                            All questions were answered, and informed consent                            was obtained. Prior Anticoagulants: The patient has                            taken no previous anticoagulant or antiplatelet                            agents except for NSAID medication. ASA Grade                            Assessment: II - A patient with mild systemic  disease. After reviewing the risks and benefits,                            the patient was deemed in satisfactory condition to                            undergo the procedure.                           After obtaining informed consent, the colonoscope                            was passed under direct vision. Throughout the                            procedure,  the patient's blood pressure, pulse, and                            oxygen saturations were monitored continuously. The                            CF-HQ190L ([id number]) scope was introduced through                            the anus and advanced to the the cecum, identified                            by appendiceal orifice and ileocecal valve. The                            colonoscopy was somewhat difficult due to a                            redundant colon. Successful completion of the                            procedure was aided by changing the patient to a                            supine position. The patient tolerated the                            procedure well. The quality of the bowel                            preparation was good. The ileocecal valve,                            appendiceal orifice, and rectum were photographed. Scope In: 10:16:02 AM Scope Out: 76:19:50 AM Scope Withdrawal Time: 0 hours 13 minutes 12 seconds  Total Procedure Duration: 0 hours 30 minutes 40 seconds  Findings:      The perianal and digital rectal examinations were normal.      An area of mild melanosis was found in  the sigmoid colon.      A small polyp was found in the mid sigmoid colon. The polyp was sessile.       Biopsies were taken with a cold forceps for histology.      Scattered diverticula were found in the sigmoid colon.      External and internal hemorrhoids were found during retroflexion. The       hemorrhoids were moderate. Impression:               - Melanosis in the colon.                           - One small polyp in the mid sigmoid colon.                            Biopsied.                           - Diverticulosis in the sigmoid colon.                           - External and internal hemorrhoids. Moderate Sedation:      Moderate (conscious) sedation was administered by the endoscopy nurse       and supervised by the endoscopist. The following parameters were        monitored: oxygen saturation, heart rate, blood pressure, CO2       capnography and response to care. Total physician intraservice time was       37 minutes. Recommendation:           - Patient has a contact number available for                            emergencies. The signs and symptoms of potential                            delayed complications were discussed with the                            patient. Return to normal activities tomorrow.                            Written discharge instructions were provided to the                            patient.                           - High fiber diet today.                           - Continue present medications.                           - No aspirin, ibuprofen, naproxen, or other                            non-steroidal anti-inflammatory drugs for 1 day.                           -  Await pathology results.                           - Repeat colonoscopy is recommended. The                            colonoscopy date will be determined after pathology                            results from today's exam become available for                            review. Procedure Code(s):        --- Professional ---                           [phone], Colonoscopy, flexible; with biopsy, single                            or multiple                           99153, Moderate sedation; each additional 15                            minutes intraservice time                           G0500, Moderate sedation services provided by the                            same physician or other qualified health care                            professional performing a gastrointestinal                            endoscopic service that sedation supports,                            requiring the presence of an independent trained                            observer to assist in the monitoring of the                            patient's level of consciousness and  physiological                            status; initial 15 minutes of intra-service time;                            patient age 39 years or older (additional time may                            be reported with [phone],  as appropriate) Diagnosis Code(s):        --- Professional ---                           Z12.11, Encounter for screening for malignant                            neoplasm of colon                           K63.89, Other specified diseases of intestine                           K63.5, Polyp of colon                           K64.8, Other hemorrhoids                           K57.30, Diverticulosis of large intestine without                            perforation or abscess without bleeding CPT copyright 2019 American [employer] Association. All rights reserved. The codes documented in this report are preliminary and upon coder review may  be revised to meet current compliance requirements. Hildred Laser, MD Hildred Laser, MD 03/10/2019 10:59:03 AM This report has been signed electronically. Number of Addenda: 0

## 2019-03-10 NOTE — Discharge Instructions (Signed)
No aspirin or NSAIDs for 24 hours. Resume other medications as before. High-fiber diet. No driving for 24 hours. Physician will call with biopsy results.   Colonoscopy, Adult, Care After This sheet gives you information about how to care for yourself after your procedure. Your doctor may also give you more specific instructions. If you have problems or questions, call your doctor. What can I expect after the procedure? After the procedure, it is common to have:  A small amount of blood in your poop for 24 hours.  Some gas.  Mild cramping or bloating in your belly. Follow these instructions at home: General instructions  For the first 24 hours after the procedure: ? Do not drive or use machinery. ? Do not sign important documents. ? Do not drink alcohol. ? Do your daily activities more slowly than normal. ? Eat foods that are soft and easy to digest.  Take over-the-counter or prescription medicines only as told by your doctor. To help cramping and bloating:   Try walking around.  Put heat on your belly (abdomen) as told by your doctor. Use a heat source that your doctor recommends, such as a moist heat pack or a heating pad. ? Put a towel between your skin and the heat source. ? Leave the heat on for 20-30 minutes. ? Remove the heat if your skin turns bright red. This is especially important if you cannot feel pain, heat, or cold. You can get burned. Eating and drinking   Drink enough fluid to keep your pee (urine) clear or pale yellow.  Return to your normal diet as told by your doctor. Avoid heavy or fried foods that are hard to digest.  Avoid drinking alcohol for as long as told by your doctor. Contact a doctor if:  You have blood in your poop (stool) 2-3 days after the procedure. Get help right away if:  You have more than a small amount of blood in your poop.  You see large clumps of tissue (blood clots) in your poop.  Your belly is swollen.  You feel sick  to your stomach (nauseous).  You throw up (vomit).  You have a fever.  You have belly pain that gets worse, and medicine does not help your pain. Summary  After the procedure, it is common to have a small amount of blood in your poop. You may also have mild cramping and bloating in your belly.  For the first 24 hours after the procedure, do not drive or use machinery, do not sign important documents, and do not drink alcohol.  Get help right away if you have a lot of blood in your poop, feel sick to your stomach, have a fever, or have more belly pain. This information is not intended to replace advice given to you by your health care provider. Make sure you discuss any questions you have with your health care provider. Document Released: 10/19/2010 Document Revised: 07/17/2017 Document Reviewed: 06/10/2016 Elsevier Interactive Patient Education  2019 [employer].  Hemorrhoids Hemorrhoids are swollen veins in and around the rectum or anus. There are two types of hemorrhoids:  Internal hemorrhoids. These occur in the veins that are just inside the rectum. They may poke through to the outside and become irritated and painful.  External hemorrhoids. These occur in the veins that are outside the anus and can be felt as a painful swelling or hard lump near the anus. Most hemorrhoids do not cause serious problems, and they can be managed with  home treatments such as diet and lifestyle changes. If home treatments do not help the symptoms, procedures can be done to shrink or remove the hemorrhoids. What are the causes? This condition is caused by increased pressure in the anal area. This pressure may result from various things, including:  Constipation.  Straining to have a bowel movement.  Diarrhea.  Pregnancy.  Obesity.  Sitting for long periods of time.  Heavy lifting or other activity that causes you to strain.  Anal sex.  Riding a bike for a long period of time. What are  the signs or symptoms? Symptoms of this condition include:  Pain.  Anal itching or irritation.  Rectal bleeding.  Leakage of stool (feces).  Anal swelling.  One or more lumps around the anus. How is this diagnosed? This condition can often be diagnosed through a visual exam. Other exams or tests may also be done, such as:  An exam that involves feeling the rectal area with a gloved hand (digital rectal exam).  An exam of the anal canal that is done using a small tube (anoscope).  A blood test, if you have lost a significant amount of blood.  A test to look inside the colon using a flexible tube with a camera on the end (sigmoidoscopy or colonoscopy). How is this treated? This condition can usually be treated at home. However, various procedures may be done if dietary changes, lifestyle changes, and other home treatments do not help your symptoms. These procedures can help make the hemorrhoids smaller or remove them completely. Some of these procedures involve surgery, and others do not. Common procedures include:  Rubber band ligation. Rubber bands are placed at the base of the hemorrhoids to cut off their blood supply.  Sclerotherapy. Medicine is injected into the hemorrhoids to shrink them.  Infrared coagulation. A type of light energy is used to get rid of the hemorrhoids.  Hemorrhoidectomy surgery. The hemorrhoids are surgically removed, and the veins that supply them are tied off.  Stapled hemorrhoidopexy surgery. The surgeon staples the base of the hemorrhoid to the rectal wall. Follow these instructions at home: Eating and drinking   Eat foods that have a lot of fiber in them, such as whole grains, beans, nuts, fruits, and vegetables.  Ask your health care provider about taking products that have added fiber (fiber supplements).  Reduce the amount of fat in your diet. You can do this by eating low-fat dairy products, eating less red meat, and avoiding processed  foods.  Drink enough fluid to keep your urine pale yellow. Managing pain and swelling   Take warm sitz baths for 20 minutes, 3-4 times a day to ease pain and discomfort. You may do this in a bathtub or using a portable sitz bath that fits over the toilet.  If directed, apply ice to the affected area. Using ice packs between sitz baths may be helpful. ? Put ice in a plastic bag. ? Place a towel between your skin and the bag. ? Leave the ice on for 20 minutes, 2-3 times a day. General instructions  Take over-the-counter and prescription medicines only as told by your health care provider.  Use medicated creams or suppositories as told.  Get regular exercise. Ask your health care provider how much and what kind of exercise is best for you. In general, you should do moderate exercise for at least 30 minutes on most days of the week (150 minutes each week). This can include activities such as walking,  biking, or yoga.  Go to the bathroom when you have the urge to have a bowel movement. Do not wait.  Avoid straining to have bowel movements.  Keep the anal area dry and clean. Use wet toilet paper or moist towelettes after a bowel movement.  Do not sit on the toilet for long periods of time. This increases blood pooling and pain.  Keep all follow-up visits as told by your health care provider. This is important. Contact a health care provider if you have:  Increasing pain and swelling that are not controlled by treatment or medicine.  Difficulty having a bowel movement, or you are unable to have a bowel movement.  Pain or inflammation outside the area of the hemorrhoids. Get help right away if you have:  Uncontrolled bleeding from your rectum. Summary  Hemorrhoids are swollen veins in and around the rectum or anus.  Most hemorrhoids can be managed with home treatments such as diet and lifestyle changes.  Taking warm sitz baths can help ease pain and discomfort.  In severe  cases, procedures or surgery can be done to shrink or remove the hemorrhoids. This information is not intended to replace advice given to you by your health care provider. Make sure you discuss any questions you have with your health care provider. Document Released: 09/13/2000 Document Revised: 02/05/2018 Document Reviewed: 02/05/2018 Elsevier Interactive Patient Education  2019 [employer].  Diverticulosis  Diverticulosis is a condition that develops when small pouches (diverticula) form in the wall of the large intestine (colon). The colon is where water is absorbed and stool is formed. The pouches form when the inside layer of the colon pushes through weak spots in the outer layers of the colon. You may have a few pouches or many of them. What are the causes? The cause of this condition is not known. What increases the risk? The following factors may make you more likely to develop this condition:  Being older than age 53. Your risk for this condition increases with age. Diverticulosis is rare among people younger than age 66. By age 110, many people have it.  Eating a low-fiber diet.  Having frequent constipation.  Being overweight.  Not getting enough exercise.  Smoking.  Taking over-the-counter pain medicines, like aspirin and ibuprofen.  Having a family history of diverticulosis. What are the signs or symptoms? In most people, there are no symptoms of this condition. If you do have symptoms, they may include:  Bloating.  Cramps in the abdomen.  Constipation or diarrhea.  Pain in the lower left side of the abdomen. How is this diagnosed? This condition is most often diagnosed during an exam for other colon problems. Because diverticulosis usually has no symptoms, it often cannot be diagnosed independently. This condition may be diagnosed by:  Using a flexible scope to examine the colon (colonoscopy).  Taking an X-ray of the colon after dye has been put into the  colon (barium enema).  Doing a CT scan. How is this treated? You may not need treatment for this condition if you have never developed an infection related to diverticulosis. If you have had an infection before, treatment may include:  Eating a high-fiber diet. This may include eating more fruits, vegetables, and grains.  Taking a fiber supplement.  Taking a live bacteria supplement (probiotic).  Taking medicine to relax your colon.  Taking antibiotic medicines. Follow these instructions at home:  Drink 6-8 glasses of water or more each day to prevent constipation.  Try  not to strain when you have a bowel movement.  If you have had an infection before: ? Eat more fiber as directed by your health care provider or your diet and nutrition specialist (dietitian). ? Take a fiber supplement or probiotic, if your health care provider approves.  Take over-the-counter and prescription medicines only as told by your health care provider.  If you were prescribed an antibiotic, take it as told by your health care provider. Do not stop taking the antibiotic even if you start to feel better.  Keep all follow-up visits as told by your health care provider. This is important. Contact a health care provider if:  You have pain in your abdomen.  You have bloating.  You have cramps.  You have not had a bowel movement in 3 days. Get help right away if:  Your pain gets worse.  Your bloating becomes very bad.  You have a fever or chills, and your symptoms suddenly get worse.  You vomit.  You have bowel movements that are bloody or black.  You have bleeding from your rectum. Summary  Diverticulosis is a condition that develops when small pouches (diverticula) form in the wall of the large intestine (colon).  You may have a few pouches or many of them.  This condition is most often diagnosed during an exam for other colon problems.  If you have had an infection related to  diverticulosis, treatment may include increasing the fiber in your diet, taking supplements, or taking medicines. This information is not intended to replace advice given to you by your health care provider. Make sure you discuss any questions you have with your health care provider. Document Released: 06/13/2004 Document Revised: 08/05/2016 Document Reviewed: 08/05/2016 Elsevier Interactive Patient Education  2019 [employer].

## 2019-03-10 NOTE — H&P (Signed)
Kurt Duke is an 73 y.o. male.   Chief Complaint: Patient is here for colonoscopy. HPI: Patient is 73 year old Caucasian male who is here for screening colonoscopy.  Last exam was 10 years ago.  He denies abdominal pain change in bowel habits or rectal bleeding.  He is prone to constipation and takes Peri-Colace on as-needed basis.  He does not feel that he is having to take it more often now. Family history is negative for CRC.  Past Medical History:  Diagnosis Date  . Insomnia   . Low back pain     Past Surgical History:  Procedure Laterality Date  . APPENDECTOMY    . CARDIAC CATHETERIZATION     2010  . CHOLECYSTECTOMY    . COLONOSCOPY     2009  . INGUINAL HERNIA REPAIR     Bilateral  . Knee surgeries     x 4  . VEIN LIGATION AND STRIPPING      History reviewed. No pertinent family history. Social History:  reports that he has never smoked. He has never used smokeless tobacco. He reports current alcohol use of about 1.0 standard drinks of alcohol per week. He reports that he does not use drugs.  Allergies:  Allergies  Allergen Reactions  . Vicodin [Hydrocodone-Acetaminophen]     Unknown reaction   . Doxycycline Other (See Comments)    Skin sensitivity    Medications Prior to Admission  Medication Sig Dispense Refill  . acetaminophen (TYLENOL) 500 MG tablet Take 500 mg by mouth 3 (three) times daily as needed for moderate pain or headache.    . docusate sodium (COLACE) 100 MG capsule Take 200 mg by mouth daily as needed for mild constipation.     Marland Kitchen ibuprofen (ADVIL) 200 MG tablet Take 200 mg by mouth 2 (two) times daily as needed for moderate pain.    Marland Kitchen loratadine (CLARITIN) 10 MG tablet Take 10 mg by mouth daily as needed for allergies.    . Menthol-Methyl Salicylate (MUSCLE RUB EX) Apply 1 application topically daily as needed (pain).    . traMADol (ULTRAM) 50 MG tablet Take 50 mg by mouth 3 (three) times daily as needed for moderate pain.     Marland Kitchen zolpidem (AMBIEN)  10 MG tablet Take 10 mg by mouth at bedtime.      No results found for this or any previous visit (from the past 48 hour(s)). No results found.  ROS  Blood pressure (!) 143/86, pulse (!) 105, temperature 98.1 F (36.7 C), temperature source Oral, resp. rate 12, height 5\' 11"  (1.803 m), weight 83 kg, SpO2 100 %. Physical Exam  Constitutional: He appears well-developed and well-nourished.  HENT:  Mouth/Throat: Oropharynx is clear and moist.  Eyes: Conjunctivae are normal. No scleral icterus.  Neck: No thyromegaly present.  Cardiovascular: Normal rate, regular rhythm and normal heart sounds.  No murmur heard. Respiratory: Effort normal and breath sounds normal.  GI:  Abdomen is symmetrical.  He has vertical appendectomy scar as well as scar in each inguinal area.  Abdomen is soft and nontender with no organomegaly or masses.  Musculoskeletal:        General: No edema.  Lymphadenopathy:    He has no cervical adenopathy.  Neurological: He is alert.  Skin: Skin is warm and dry.     Assessment/Plan Average rescreening colonoscopy.  Hildred Laser, MD 03/10/2019, 10:05 AM

## 2019-03-16 ENCOUNTER — Encounter (HOSPITAL_COMMUNITY): Payer: Self-pay | Admitting: Internal Medicine

## 2019-10-18 DIAGNOSIS — M170 Bilateral primary osteoarthritis of knee: Secondary | ICD-10-CM | POA: Diagnosis not present

## 2019-11-17 DIAGNOSIS — G4700 Insomnia, unspecified: Secondary | ICD-10-CM | POA: Diagnosis not present

## 2019-11-17 DIAGNOSIS — Z789 Other specified health status: Secondary | ICD-10-CM | POA: Diagnosis not present

## 2019-11-17 DIAGNOSIS — Z299 Encounter for prophylactic measures, unspecified: Secondary | ICD-10-CM | POA: Diagnosis not present

## 2019-11-17 DIAGNOSIS — Z713 Dietary counseling and surveillance: Secondary | ICD-10-CM | POA: Diagnosis not present

## 2020-02-14 DIAGNOSIS — Z79899 Other long term (current) drug therapy: Secondary | ICD-10-CM | POA: Diagnosis not present

## 2020-02-14 DIAGNOSIS — Z299 Encounter for prophylactic measures, unspecified: Secondary | ICD-10-CM | POA: Diagnosis not present

## 2020-02-14 DIAGNOSIS — Z7189 Other specified counseling: Secondary | ICD-10-CM | POA: Diagnosis not present

## 2020-02-14 DIAGNOSIS — Z1211 Encounter for screening for malignant neoplasm of colon: Secondary | ICD-10-CM | POA: Diagnosis not present

## 2020-02-14 DIAGNOSIS — R5383 Other fatigue: Secondary | ICD-10-CM | POA: Diagnosis not present

## 2020-02-14 DIAGNOSIS — E7800 Pure hypercholesterolemia, unspecified: Secondary | ICD-10-CM | POA: Diagnosis not present

## 2020-02-14 DIAGNOSIS — Z0000 Encounter for general adult medical examination without abnormal findings: Secondary | ICD-10-CM | POA: Diagnosis not present

## 2020-05-16 DIAGNOSIS — N183 Chronic kidney disease, stage 3 unspecified: Secondary | ICD-10-CM | POA: Diagnosis not present

## 2020-05-16 DIAGNOSIS — G4700 Insomnia, unspecified: Secondary | ICD-10-CM | POA: Diagnosis not present

## 2020-05-16 DIAGNOSIS — Z299 Encounter for prophylactic measures, unspecified: Secondary | ICD-10-CM | POA: Diagnosis not present

## 2020-05-16 DIAGNOSIS — M545 Low back pain: Secondary | ICD-10-CM | POA: Diagnosis not present

## 2020-08-15 DIAGNOSIS — M545 Low back pain, unspecified: Secondary | ICD-10-CM | POA: Diagnosis not present

## 2020-08-15 DIAGNOSIS — Z299 Encounter for prophylactic measures, unspecified: Secondary | ICD-10-CM | POA: Diagnosis not present

## 2020-08-15 DIAGNOSIS — G4700 Insomnia, unspecified: Secondary | ICD-10-CM | POA: Diagnosis not present

## 2020-08-15 DIAGNOSIS — Z79899 Other long term (current) drug therapy: Secondary | ICD-10-CM | POA: Diagnosis not present

## 2020-11-16 DIAGNOSIS — Z299 Encounter for prophylactic measures, unspecified: Secondary | ICD-10-CM | POA: Diagnosis not present

## 2020-11-16 DIAGNOSIS — Z79899 Other long term (current) drug therapy: Secondary | ICD-10-CM | POA: Diagnosis not present

## 2020-11-16 DIAGNOSIS — N183 Chronic kidney disease, stage 3 unspecified: Secondary | ICD-10-CM | POA: Diagnosis not present

## 2020-11-16 DIAGNOSIS — M545 Low back pain, unspecified: Secondary | ICD-10-CM | POA: Diagnosis not present

## 2020-11-16 DIAGNOSIS — G4700 Insomnia, unspecified: Secondary | ICD-10-CM | POA: Diagnosis not present

## 2020-12-20 DIAGNOSIS — M1611 Unilateral primary osteoarthritis, right hip: Secondary | ICD-10-CM | POA: Diagnosis not present

## 2020-12-20 DIAGNOSIS — M4126 Other idiopathic scoliosis, lumbar region: Secondary | ICD-10-CM | POA: Diagnosis not present

## 2020-12-20 DIAGNOSIS — M47816 Spondylosis without myelopathy or radiculopathy, lumbar region: Secondary | ICD-10-CM | POA: Diagnosis not present

## 2020-12-20 DIAGNOSIS — M9903 Segmental and somatic dysfunction of lumbar region: Secondary | ICD-10-CM | POA: Diagnosis not present

## 2020-12-22 DIAGNOSIS — M4126 Other idiopathic scoliosis, lumbar region: Secondary | ICD-10-CM | POA: Diagnosis not present

## 2020-12-22 DIAGNOSIS — M47816 Spondylosis without myelopathy or radiculopathy, lumbar region: Secondary | ICD-10-CM | POA: Diagnosis not present

## 2020-12-22 DIAGNOSIS — M9903 Segmental and somatic dysfunction of lumbar region: Secondary | ICD-10-CM | POA: Diagnosis not present

## 2020-12-22 DIAGNOSIS — M1611 Unilateral primary osteoarthritis, right hip: Secondary | ICD-10-CM | POA: Diagnosis not present

## 2020-12-28 DIAGNOSIS — M47816 Spondylosis without myelopathy or radiculopathy, lumbar region: Secondary | ICD-10-CM | POA: Diagnosis not present

## 2020-12-28 DIAGNOSIS — M4126 Other idiopathic scoliosis, lumbar region: Secondary | ICD-10-CM | POA: Diagnosis not present

## 2020-12-28 DIAGNOSIS — M9903 Segmental and somatic dysfunction of lumbar region: Secondary | ICD-10-CM | POA: Diagnosis not present

## 2020-12-28 DIAGNOSIS — M1611 Unilateral primary osteoarthritis, right hip: Secondary | ICD-10-CM | POA: Diagnosis not present

## 2021-01-04 DIAGNOSIS — M1611 Unilateral primary osteoarthritis, right hip: Secondary | ICD-10-CM | POA: Diagnosis not present

## 2021-01-04 DIAGNOSIS — M4126 Other idiopathic scoliosis, lumbar region: Secondary | ICD-10-CM | POA: Diagnosis not present

## 2021-01-04 DIAGNOSIS — M9903 Segmental and somatic dysfunction of lumbar region: Secondary | ICD-10-CM | POA: Diagnosis not present

## 2021-01-04 DIAGNOSIS — M47816 Spondylosis without myelopathy or radiculopathy, lumbar region: Secondary | ICD-10-CM | POA: Diagnosis not present

## 2021-01-11 DIAGNOSIS — M1611 Unilateral primary osteoarthritis, right hip: Secondary | ICD-10-CM | POA: Diagnosis not present

## 2021-01-11 DIAGNOSIS — M4126 Other idiopathic scoliosis, lumbar region: Secondary | ICD-10-CM | POA: Diagnosis not present

## 2021-01-11 DIAGNOSIS — M47816 Spondylosis without myelopathy or radiculopathy, lumbar region: Secondary | ICD-10-CM | POA: Diagnosis not present

## 2021-01-11 DIAGNOSIS — M9903 Segmental and somatic dysfunction of lumbar region: Secondary | ICD-10-CM | POA: Diagnosis not present

## 2021-01-18 DIAGNOSIS — M1611 Unilateral primary osteoarthritis, right hip: Secondary | ICD-10-CM | POA: Diagnosis not present

## 2021-01-18 DIAGNOSIS — M47816 Spondylosis without myelopathy or radiculopathy, lumbar region: Secondary | ICD-10-CM | POA: Diagnosis not present

## 2021-01-18 DIAGNOSIS — M9903 Segmental and somatic dysfunction of lumbar region: Secondary | ICD-10-CM | POA: Diagnosis not present

## 2021-01-18 DIAGNOSIS — M4126 Other idiopathic scoliosis, lumbar region: Secondary | ICD-10-CM | POA: Diagnosis not present

## 2021-01-25 DIAGNOSIS — M4126 Other idiopathic scoliosis, lumbar region: Secondary | ICD-10-CM | POA: Diagnosis not present

## 2021-01-25 DIAGNOSIS — M1611 Unilateral primary osteoarthritis, right hip: Secondary | ICD-10-CM | POA: Diagnosis not present

## 2021-01-25 DIAGNOSIS — M9903 Segmental and somatic dysfunction of lumbar region: Secondary | ICD-10-CM | POA: Diagnosis not present

## 2021-01-25 DIAGNOSIS — M47816 Spondylosis without myelopathy or radiculopathy, lumbar region: Secondary | ICD-10-CM | POA: Diagnosis not present

## 2021-02-06 DIAGNOSIS — H26491 Other secondary cataract, right eye: Secondary | ICD-10-CM | POA: Diagnosis not present

## 2021-02-08 DIAGNOSIS — M47816 Spondylosis without myelopathy or radiculopathy, lumbar region: Secondary | ICD-10-CM | POA: Diagnosis not present

## 2021-02-08 DIAGNOSIS — M1611 Unilateral primary osteoarthritis, right hip: Secondary | ICD-10-CM | POA: Diagnosis not present

## 2021-02-08 DIAGNOSIS — M4126 Other idiopathic scoliosis, lumbar region: Secondary | ICD-10-CM | POA: Diagnosis not present

## 2021-02-08 DIAGNOSIS — M9903 Segmental and somatic dysfunction of lumbar region: Secondary | ICD-10-CM | POA: Diagnosis not present

## 2021-02-14 DIAGNOSIS — M545 Low back pain, unspecified: Secondary | ICD-10-CM | POA: Diagnosis not present

## 2021-02-14 DIAGNOSIS — M25551 Pain in right hip: Secondary | ICD-10-CM | POA: Diagnosis not present

## 2021-02-16 DIAGNOSIS — Z79899 Other long term (current) drug therapy: Secondary | ICD-10-CM | POA: Diagnosis not present

## 2021-02-16 DIAGNOSIS — Z7189 Other specified counseling: Secondary | ICD-10-CM | POA: Diagnosis not present

## 2021-02-16 DIAGNOSIS — R5383 Other fatigue: Secondary | ICD-10-CM | POA: Diagnosis not present

## 2021-02-16 DIAGNOSIS — E7800 Pure hypercholesterolemia, unspecified: Secondary | ICD-10-CM | POA: Diagnosis not present

## 2021-02-16 DIAGNOSIS — Z299 Encounter for prophylactic measures, unspecified: Secondary | ICD-10-CM | POA: Diagnosis not present

## 2021-02-16 DIAGNOSIS — Z0000 Encounter for general adult medical examination without abnormal findings: Secondary | ICD-10-CM | POA: Diagnosis not present

## 2021-02-21 DIAGNOSIS — M1611 Unilateral primary osteoarthritis, right hip: Secondary | ICD-10-CM | POA: Diagnosis not present

## 2021-04-24 DIAGNOSIS — Z299 Encounter for prophylactic measures, unspecified: Secondary | ICD-10-CM | POA: Diagnosis not present

## 2021-04-24 DIAGNOSIS — U071 COVID-19: Secondary | ICD-10-CM | POA: Diagnosis not present

## 2021-05-29 DIAGNOSIS — M4722 Other spondylosis with radiculopathy, cervical region: Secondary | ICD-10-CM | POA: Diagnosis not present

## 2021-05-29 DIAGNOSIS — S46011A Strain of muscle(s) and tendon(s) of the rotator cuff of right shoulder, initial encounter: Secondary | ICD-10-CM | POA: Diagnosis not present

## 2021-06-08 DIAGNOSIS — M75101 Unspecified rotator cuff tear or rupture of right shoulder, not specified as traumatic: Secondary | ICD-10-CM | POA: Diagnosis not present

## 2021-06-08 DIAGNOSIS — M25511 Pain in right shoulder: Secondary | ICD-10-CM | POA: Diagnosis not present

## 2021-06-12 DIAGNOSIS — S46011D Strain of muscle(s) and tendon(s) of the rotator cuff of right shoulder, subsequent encounter: Secondary | ICD-10-CM | POA: Diagnosis not present

## 2021-06-12 DIAGNOSIS — M4722 Other spondylosis with radiculopathy, cervical region: Secondary | ICD-10-CM | POA: Diagnosis not present

## 2021-07-03 DIAGNOSIS — L570 Actinic keratosis: Secondary | ICD-10-CM | POA: Diagnosis not present

## 2021-07-03 DIAGNOSIS — Z85828 Personal history of other malignant neoplasm of skin: Secondary | ICD-10-CM | POA: Diagnosis not present

## 2021-07-06 DIAGNOSIS — Z299 Encounter for prophylactic measures, unspecified: Secondary | ICD-10-CM | POA: Diagnosis not present

## 2021-07-06 DIAGNOSIS — J329 Chronic sinusitis, unspecified: Secondary | ICD-10-CM | POA: Diagnosis not present

## 2021-07-06 DIAGNOSIS — M2550 Pain in unspecified joint: Secondary | ICD-10-CM | POA: Diagnosis not present

## 2021-07-06 DIAGNOSIS — R5383 Other fatigue: Secondary | ICD-10-CM | POA: Diagnosis not present

## 2021-07-06 DIAGNOSIS — R21 Rash and other nonspecific skin eruption: Secondary | ICD-10-CM | POA: Diagnosis not present

## 2021-07-24 DIAGNOSIS — S46011D Strain of muscle(s) and tendon(s) of the rotator cuff of right shoulder, subsequent encounter: Secondary | ICD-10-CM | POA: Diagnosis not present

## 2021-07-31 DIAGNOSIS — M75111 Incomplete rotator cuff tear or rupture of right shoulder, not specified as traumatic: Secondary | ICD-10-CM | POA: Diagnosis not present

## 2021-08-02 DIAGNOSIS — M75111 Incomplete rotator cuff tear or rupture of right shoulder, not specified as traumatic: Secondary | ICD-10-CM | POA: Diagnosis not present

## 2021-08-07 DIAGNOSIS — M75111 Incomplete rotator cuff tear or rupture of right shoulder, not specified as traumatic: Secondary | ICD-10-CM | POA: Diagnosis not present

## 2021-08-09 DIAGNOSIS — M75111 Incomplete rotator cuff tear or rupture of right shoulder, not specified as traumatic: Secondary | ICD-10-CM | POA: Diagnosis not present

## 2021-08-14 DIAGNOSIS — H524 Presbyopia: Secondary | ICD-10-CM | POA: Diagnosis not present

## 2021-08-14 DIAGNOSIS — M75111 Incomplete rotator cuff tear or rupture of right shoulder, not specified as traumatic: Secondary | ICD-10-CM | POA: Diagnosis not present

## 2021-08-14 DIAGNOSIS — H353121 Nonexudative age-related macular degeneration, left eye, early dry stage: Secondary | ICD-10-CM | POA: Diagnosis not present

## 2021-08-16 DIAGNOSIS — M75111 Incomplete rotator cuff tear or rupture of right shoulder, not specified as traumatic: Secondary | ICD-10-CM | POA: Diagnosis not present

## 2021-08-21 DIAGNOSIS — M75111 Incomplete rotator cuff tear or rupture of right shoulder, not specified as traumatic: Secondary | ICD-10-CM | POA: Diagnosis not present

## 2021-08-24 DIAGNOSIS — M75111 Incomplete rotator cuff tear or rupture of right shoulder, not specified as traumatic: Secondary | ICD-10-CM | POA: Diagnosis not present

## 2021-08-28 DIAGNOSIS — G4700 Insomnia, unspecified: Secondary | ICD-10-CM | POA: Diagnosis not present

## 2021-08-28 DIAGNOSIS — Z299 Encounter for prophylactic measures, unspecified: Secondary | ICD-10-CM | POA: Diagnosis not present

## 2021-08-28 DIAGNOSIS — Z79899 Other long term (current) drug therapy: Secondary | ICD-10-CM | POA: Diagnosis not present

## 2021-08-28 DIAGNOSIS — S46011D Strain of muscle(s) and tendon(s) of the rotator cuff of right shoulder, subsequent encounter: Secondary | ICD-10-CM | POA: Diagnosis not present

## 2021-08-28 DIAGNOSIS — M545 Low back pain, unspecified: Secondary | ICD-10-CM | POA: Diagnosis not present

## 2021-10-01 DIAGNOSIS — H26493 Other secondary cataract, bilateral: Secondary | ICD-10-CM | POA: Diagnosis not present

## 2021-10-01 DIAGNOSIS — H43813 Vitreous degeneration, bilateral: Secondary | ICD-10-CM | POA: Diagnosis not present

## 2021-10-01 DIAGNOSIS — H353122 Nonexudative age-related macular degeneration, left eye, intermediate dry stage: Secondary | ICD-10-CM | POA: Diagnosis not present

## 2021-10-01 DIAGNOSIS — H26491 Other secondary cataract, right eye: Secondary | ICD-10-CM | POA: Diagnosis not present

## 2021-10-01 DIAGNOSIS — H35361 Drusen (degenerative) of macula, right eye: Secondary | ICD-10-CM | POA: Diagnosis not present

## 2021-10-05 DIAGNOSIS — L309 Dermatitis, unspecified: Secondary | ICD-10-CM | POA: Diagnosis not present

## 2021-10-05 DIAGNOSIS — Z789 Other specified health status: Secondary | ICD-10-CM | POA: Diagnosis not present

## 2021-10-05 DIAGNOSIS — J309 Allergic rhinitis, unspecified: Secondary | ICD-10-CM | POA: Diagnosis not present

## 2021-10-05 DIAGNOSIS — Z299 Encounter for prophylactic measures, unspecified: Secondary | ICD-10-CM | POA: Diagnosis not present

## 2021-10-05 DIAGNOSIS — N183 Chronic kidney disease, stage 3 unspecified: Secondary | ICD-10-CM | POA: Diagnosis not present

## 2021-10-15 DIAGNOSIS — H26492 Other secondary cataract, left eye: Secondary | ICD-10-CM | POA: Diagnosis not present

## 2022-02-05 DIAGNOSIS — L7211 Pilar cyst: Secondary | ICD-10-CM | POA: Diagnosis not present

## 2022-02-14 DIAGNOSIS — L7211 Pilar cyst: Secondary | ICD-10-CM | POA: Diagnosis not present

## 2022-02-14 DIAGNOSIS — L7212 Trichodermal cyst: Secondary | ICD-10-CM | POA: Diagnosis not present

## 2022-02-18 DIAGNOSIS — M25561 Pain in right knee: Secondary | ICD-10-CM | POA: Diagnosis not present

## 2022-02-18 DIAGNOSIS — M25562 Pain in left knee: Secondary | ICD-10-CM | POA: Diagnosis not present

## 2022-02-21 DIAGNOSIS — M545 Low back pain, unspecified: Secondary | ICD-10-CM | POA: Diagnosis not present

## 2022-02-21 DIAGNOSIS — Z7189 Other specified counseling: Secondary | ICD-10-CM | POA: Diagnosis not present

## 2022-02-21 DIAGNOSIS — Z0000 Encounter for general adult medical examination without abnormal findings: Secondary | ICD-10-CM | POA: Diagnosis not present

## 2022-02-21 DIAGNOSIS — R5383 Other fatigue: Secondary | ICD-10-CM | POA: Diagnosis not present

## 2022-02-21 DIAGNOSIS — Z299 Encounter for prophylactic measures, unspecified: Secondary | ICD-10-CM | POA: Diagnosis not present

## 2022-02-21 DIAGNOSIS — Z79899 Other long term (current) drug therapy: Secondary | ICD-10-CM | POA: Diagnosis not present

## 2022-02-21 DIAGNOSIS — G4700 Insomnia, unspecified: Secondary | ICD-10-CM | POA: Diagnosis not present

## 2022-02-21 DIAGNOSIS — E7800 Pure hypercholesterolemia, unspecified: Secondary | ICD-10-CM | POA: Diagnosis not present

## 2022-04-17 DIAGNOSIS — H43813 Vitreous degeneration, bilateral: Secondary | ICD-10-CM | POA: Diagnosis not present

## 2022-04-23 DIAGNOSIS — Z789 Other specified health status: Secondary | ICD-10-CM | POA: Diagnosis not present

## 2022-04-23 DIAGNOSIS — R079 Chest pain, unspecified: Secondary | ICD-10-CM | POA: Diagnosis not present

## 2022-04-23 DIAGNOSIS — I8390 Asymptomatic varicose veins of unspecified lower extremity: Secondary | ICD-10-CM | POA: Diagnosis not present

## 2022-04-23 DIAGNOSIS — R0789 Other chest pain: Secondary | ICD-10-CM | POA: Diagnosis not present

## 2022-04-23 DIAGNOSIS — Z299 Encounter for prophylactic measures, unspecified: Secondary | ICD-10-CM | POA: Diagnosis not present

## 2022-04-23 DIAGNOSIS — D692 Other nonthrombocytopenic purpura: Secondary | ICD-10-CM | POA: Diagnosis not present

## 2022-04-29 DIAGNOSIS — R079 Chest pain, unspecified: Secondary | ICD-10-CM | POA: Diagnosis not present

## 2022-06-17 ENCOUNTER — Encounter: Payer: Self-pay | Admitting: Cardiology

## 2022-06-17 NOTE — H&P (View-Only) (Signed)
Cardiology Office Note  Date: 06/18/2022   ID: Kurt Duke, DOB 1946-07-29, MRN [id number]  PCP:  Glenda Chroman, MD  Cardiologist:  Rozann Lesches, MD Electrophysiologist:  None   Chief Complaint  Patient presents with   Chest pain    History of Present Illness: Kurt Duke is a 76 y.o. male referred for cardiology consultation by Dr. Woody Seller for the evaluation of chest pain.  He states that since May he has experienced an intermittent aching in his chest, sometimes left-sided and sometimes more central.  This occurs with certain levels of exertion, also is experiencing dyspnea on exertion at times.  He had an episode recently when he went to the local high school football game and had to walk up steps.  He was seen by Dr. Percival Spanish back in 2014 for evaluation of atypical chest discomfort.  GXT was negative for ischemia at that time.  Prior cardiac catheterization in 2011 had demonstrated mild nonobstructive coronary atherosclerosis.  I reviewed his medications which are noted below.  Today's ECG shows sinus rhythm with right bundle branch block and left anterior fascicular block.  Echocardiogram done through [facility] in July showed normal LVEF at 55 to 60% with no regional wall motion abnormalities.  Past Medical History:  Diagnosis Date   CKD (chronic kidney disease) stage 3, GFR 30-59 ml/min (HCC)    COVID-19    Degenerative disc disease, cervical    GERD (gastroesophageal reflux disease)    History of varicose veins    Insomnia    Low back pain    Mixed hyperlipidemia    Spinal stenosis     Past Surgical History:  Procedure Laterality Date   APPENDECTOMY     CARDIAC CATHETERIZATION     2010   CHOLECYSTECTOMY     COLONOSCOPY     2009   COLONOSCOPY N/A 03/10/2019   Procedure: COLONOSCOPY;  Surgeon: Rogene Houston, MD;  [facility];  Service: Endoscopy;  Laterality: N/A;  1030   INGUINAL HERNIA REPAIR     Bilateral   Knee surgeries      x 4   POLYPECTOMY  03/10/2019   Procedure: POLYPECTOMY;  Surgeon: Rogene Houston, MD;  [facility]: [facility];  Service: Endoscopy;;   VEIN LIGATION AND STRIPPING      Current Outpatient Medications  Medication Sig Dispense Refill   acetaminophen (TYLENOL) 500 MG tablet Take 500 mg by mouth 3 (three) times daily as needed for moderate pain or headache.     aspirin EC 81 MG tablet Take 81 mg by mouth daily. Swallow whole.     docusate sodium (COLACE) 100 MG capsule Take 200 mg by mouth daily as needed for mild constipation.      ibuprofen (ADVIL) 200 MG tablet Take 200 mg by mouth 2 (two) times daily as needed for moderate pain.     loratadine (CLARITIN) 10 MG tablet Take 10 mg by mouth daily as needed for allergies.     Menthol-Methyl Salicylate (MUSCLE RUB EX) Apply 1 application topically daily as needed (pain).     traMADol (ULTRAM) 50 MG tablet Take 50 mg by mouth 3 (three) times daily as needed for moderate pain.      zolpidem (AMBIEN) 10 MG tablet Take 10 mg by mouth at bedtime.     No current facility-administered medications for this visit.   Allergies:  Vicodin [hydrocodone-acetaminophen] and Doxycycline   Social History: The patient  reports that he  has never smoked. He has never used smokeless tobacco. He reports current alcohol use of about 1.0 standard drink of alcohol per week. He reports that he does not use drugs.   Family History: The patient's family history includes Alzheimer's disease in his mother.   ROS: No palpitations or syncope.  Physical Exam: VS:  BP 118/80   Pulse 92   Ht '5\' 11"'$  (1.803 m)   Wt 180 lb 9.6 oz (81.9 kg)   SpO2 97%   BMI 25.19 kg/m , BMI Body mass index is 25.19 kg/m.  Wt Readings from Last 3 Encounters:  06/18/22 180 lb 9.6 oz (81.9 kg)  03/10/19 183 lb (83 kg)  10/05/12 176 lb (79.8 kg)    General: Patient appears comfortable at rest. HEENT: Conjunctiva and lids normal. Neck: Supple, no elevated JVP or carotid bruits, no  thyromegaly. Lungs: Clear to auscultation, nonlabored breathing at rest. Cardiac: Regular rate and rhythm, no S3 or significant systolic murmur, no pericardial rub. Abdomen: Soft, nontender, bowel sounds present. Extremities: Trace ankle edema, distal pulses 2+. Skin: Warm and dry. Musculoskeletal: No kyphosis. Neuropsychiatric: Alert and oriented x3, affect grossly appropriate.  ECG:  An ECG dated 10/05/2012 was personally reviewed today and demonstrated:  Sinus rhythm with left anterior fascicular block.  Recent Labwork:  No recent lab work available for review today.  Other Studies Reviewed Today:  Echocardiogram 04/29/2022 [facility]): Normal LV chamber size and wall thickness with LVEF 55 to 60%, impaired diastolic relaxation pattern, normal aortic root size, trace aortic regurgitation, trace tricuspid regurgitation, no pericardial effusion.  Assessment and Plan:  1.  Intermittent exertional chest aching and dyspnea on exertion in a 76 year old male with history of mixed hyperlipidemia, CKD stage IIIb, and previously documented mild coronary atherosclerosis at cardiac catheterization in 2011.  ECG today shows sinus rhythm with right bundle branch block and left anterior fascicular block.  He has noticed the symptoms since May.  No interval ischemic testing since 2014.  Plan to request most recent lab work from PCP to better understand degree of his renal insufficiency and also lipid status.  He is presently not on a statin.  Also proceed with exercise Myoview for ischemic assessment.  LVEF normal at 55 to 60% by recent echocardiogram.  2.  CKD stage IIIb by history, requesting interval lab work.  Medication Adjustments/Labs and Tests Ordered: Current medicines are reviewed at length with the patient today.  Concerns regarding medicines are outlined above.   Tests Ordered: Orders Placed This Encounter  Procedures   NM Myocar Multi W/Spect W/Wall Motion / EF   EKG  12-Lead    Medication Changes: No orders of the defined types were placed in this encounter.   Disposition:  Follow up  test results.  Signed, Satira Sark, MD, [facility] 06/18/2022 11:14 AM    [place] [facility] [place], [place], [place] [address] Phone: [phone]; Fax: [phone]

## 2022-06-17 NOTE — Progress Notes (Unsigned)
Cardiology Office Note  Date: 06/18/2022   ID: Kurt Duke, DOB 04-23-1946, MRN [id number]  PCP:  Glenda Chroman, MD  Cardiologist:  Rozann Lesches, MD Electrophysiologist:  None   Chief Complaint  Patient presents with   Chest pain    History of Present Illness: Kurt Duke is a 76 y.o. male referred for cardiology consultation by Dr. Woody Seller for the evaluation of chest pain.  He states that since May he has experienced an intermittent aching in his chest, sometimes left-sided and sometimes more central.  This occurs with certain levels of exertion, also is experiencing dyspnea on exertion at times.  He had an episode recently when he went to the local high school football game and had to walk up steps.  He was seen by Dr. Percival Spanish back in 2014 for evaluation of atypical chest discomfort.  GXT was negative for ischemia at that time.  Prior cardiac catheterization in 2011 had demonstrated mild nonobstructive coronary atherosclerosis.  I reviewed his medications which are noted below.  Today's ECG shows sinus rhythm with right bundle branch block and left anterior fascicular block.  Echocardiogram done through [facility] in July showed normal LVEF at 55 to 60% with no regional wall motion abnormalities.  Past Medical History:  Diagnosis Date   CKD (chronic kidney disease) stage 3, GFR 30-59 ml/min (HCC)    COVID-19    Degenerative disc disease, cervical    GERD (gastroesophageal reflux disease)    History of varicose veins    Insomnia    Low back pain    Mixed hyperlipidemia    Spinal stenosis     Past Surgical History:  Procedure Laterality Date   APPENDECTOMY     CARDIAC CATHETERIZATION     2010   CHOLECYSTECTOMY     COLONOSCOPY     2009   COLONOSCOPY N/A 03/10/2019   Procedure: COLONOSCOPY;  Surgeon: Rogene Houston, MD;  [facility];  Service: Endoscopy;  Laterality: N/A;  1030   INGUINAL HERNIA REPAIR     Bilateral   Knee surgeries      x 4   POLYPECTOMY  03/10/2019   Procedure: POLYPECTOMY;  Surgeon: Rogene Houston, MD;  [facility];  Service: Endoscopy;;   VEIN LIGATION AND STRIPPING      Current Outpatient Medications  Medication Sig Dispense Refill   acetaminophen (TYLENOL) 500 MG tablet Take 500 mg by mouth 3 (three) times daily as needed for moderate pain or headache.     aspirin EC 81 MG tablet Take 81 mg by mouth daily. Swallow whole.     docusate sodium (COLACE) 100 MG capsule Take 200 mg by mouth daily as needed for mild constipation.      ibuprofen (ADVIL) 200 MG tablet Take 200 mg by mouth 2 (two) times daily as needed for moderate pain.     loratadine (CLARITIN) 10 MG tablet Take 10 mg by mouth daily as needed for allergies.     Menthol-Methyl Salicylate (MUSCLE RUB EX) Apply 1 application topically daily as needed (pain).     traMADol (ULTRAM) 50 MG tablet Take 50 mg by mouth 3 (three) times daily as needed for moderate pain.      zolpidem (AMBIEN) 10 MG tablet Take 10 mg by mouth at bedtime.     No current facility-administered medications for this visit.   Allergies:  Vicodin [hydrocodone-acetaminophen] and Doxycycline   Social History: The patient  reports that he  has never smoked. He has never used smokeless tobacco. He reports current alcohol use of about 1.0 standard drink of alcohol per week. He reports that he does not use drugs.   Family History: The patient's family history includes Alzheimer's disease in his mother.   ROS: No palpitations or syncope.  Physical Exam: VS:  BP 118/80   Pulse 92   Ht '5\' 11"'$  (1.803 m)   Wt 180 lb 9.6 oz (81.9 kg)   SpO2 97%   BMI 25.19 kg/m , BMI Body mass index is 25.19 kg/m.  Wt Readings from Last 3 Encounters:  06/18/22 180 lb 9.6 oz (81.9 kg)  03/10/19 183 lb (83 kg)  10/05/12 176 lb (79.8 kg)    General: Patient appears comfortable at rest. HEENT: Conjunctiva and lids normal. Neck: Supple, no elevated JVP or carotid bruits, no  thyromegaly. Lungs: Clear to auscultation, nonlabored breathing at rest. Cardiac: Regular rate and rhythm, no S3 or significant systolic murmur, no pericardial rub. Abdomen: Soft, nontender, bowel sounds present. Extremities: Trace ankle edema, distal pulses 2+. Skin: Warm and dry. Musculoskeletal: No kyphosis. Neuropsychiatric: Alert and oriented x3, affect grossly appropriate.  ECG:  An ECG dated 10/05/2012 was personally reviewed today and demonstrated:  Sinus rhythm with left anterior fascicular block.  Recent Labwork:  No recent lab work available for review today.  Other Studies Reviewed Today:  Echocardiogram 04/29/2022 [facility]): Normal LV chamber size and wall thickness with LVEF 55 to 60%, impaired diastolic relaxation pattern, normal aortic root size, trace aortic regurgitation, trace tricuspid regurgitation, no pericardial effusion.  Assessment and Plan:  1.  Intermittent exertional chest aching and dyspnea on exertion in a 76 year old male with history of mixed hyperlipidemia, CKD stage IIIb, and previously documented mild coronary atherosclerosis at cardiac catheterization in 2011.  ECG today shows sinus rhythm with right bundle branch block and left anterior fascicular block.  He has noticed the symptoms since May.  No interval ischemic testing since 2014.  Plan to request most recent lab work from PCP to better understand degree of his renal insufficiency and also lipid status.  He is presently not on a statin.  Also proceed with exercise Myoview for ischemic assessment.  LVEF normal at 55 to 60% by recent echocardiogram.  2.  CKD stage IIIb by history, requesting interval lab work.  Medication Adjustments/Labs and Tests Ordered: Current medicines are reviewed at length with the patient today.  Concerns regarding medicines are outlined above.   Tests Ordered: Orders Placed This Encounter  Procedures   NM Myocar Multi W/Spect W/Wall Motion / EF   EKG  12-Lead    Medication Changes: No orders of the defined types were placed in this encounter.   Disposition:  Follow up  test results.  Signed, Satira Sark, MD, [facility] 06/18/2022 11:14 AM    [place] at [place], [place], [place] [address] Phone: [phone]; Fax: [phone]

## 2022-06-18 ENCOUNTER — Ambulatory Visit: Payer: Medicare Other | Attending: Cardiology | Admitting: Cardiology

## 2022-06-18 ENCOUNTER — Telehealth: Payer: Self-pay | Admitting: Cardiology

## 2022-06-18 ENCOUNTER — Encounter: Payer: Self-pay | Admitting: Cardiology

## 2022-06-18 VITALS — BP 118/80 | HR 92 | Ht 71.0 in | Wt 180.6 lb

## 2022-06-18 DIAGNOSIS — R079 Chest pain, unspecified: Secondary | ICD-10-CM

## 2022-06-18 DIAGNOSIS — R0789 Other chest pain: Secondary | ICD-10-CM | POA: Diagnosis not present

## 2022-06-18 DIAGNOSIS — E782 Mixed hyperlipidemia: Secondary | ICD-10-CM | POA: Diagnosis not present

## 2022-06-18 DIAGNOSIS — N1832 Chronic kidney disease, stage 3b: Secondary | ICD-10-CM

## 2022-06-18 NOTE — Telephone Encounter (Signed)
Checking percert on the following patient for testing scheduled at [facility].     EXERCISE MYOVIEW    06/19/2022

## 2022-06-18 NOTE — Patient Instructions (Addendum)
Medication Instructions:  Your physician recommends that you continue on your current medications as directed. Please refer to the Current Medication list given to you today.   Labwork: none  Testing/Procedures: Your physician has requested that you have en exercise stress myoview. For further information please visit [email or web address]. Please follow instruction sheet, as given.   Follow-Up:  Your physician recommends that you schedule a follow-up appointment in: Follow Up Pending  Any Other Special Instructions Will Be Listed Below (If Applicable).  If you need a refill on your cardiac medications before your next appointment, please call your pharmacy.  

## 2022-06-18 NOTE — Addendum Note (Signed)
Addended by: Sung Amabile on: 06/18/2022 11:30 AM   Modules accepted: Orders

## 2022-06-18 NOTE — Addendum Note (Signed)
Addended by: Sung Amabile on: 06/18/2022 11:41 AM   Modules accepted: Orders

## 2022-06-19 ENCOUNTER — Ambulatory Visit (HOSPITAL_BASED_OUTPATIENT_CLINIC_OR_DEPARTMENT_OTHER)
Admission: RE | Admit: 2022-06-19 | Discharge: 2022-06-19 | Disposition: A | Discharge status: Home / Self Care | Payer: Medicare Other | Source: Ambulatory Visit | Attending: Cardiology | Admitting: Cardiology

## 2022-06-19 ENCOUNTER — Encounter (HOSPITAL_COMMUNITY): Payer: Self-pay

## 2022-06-19 ENCOUNTER — Ambulatory Visit (HOSPITAL_COMMUNITY)
Admission: RE | Admit: 2022-06-19 | Discharge: 2022-06-19 | Disposition: A | Discharge status: Home / Self Care | Payer: Medicare Other | Source: Ambulatory Visit | Attending: Cardiology | Admitting: Cardiology

## 2022-06-19 DIAGNOSIS — R079 Chest pain, unspecified: Secondary | ICD-10-CM | POA: Diagnosis not present

## 2022-06-19 HISTORY — DX: Malignant (primary) neoplasm, unspecified: C80.1

## 2022-06-19 LAB — NM MYOCAR MULTI W/SPECT W/WALL MOTION / EF
Angina Index: 0
Base ST Depression (mm): 0 mm
Duke Treadmill Score: 4
Estimated workload: 6.9
Exercise duration (min): 4 min
Exercise duration (sec): 0 s
LV dias vol: 81 mL (ref 62–150)
LV sys vol: 27 mL
MPHR: 143 {beats}/min
Nuc Stress EF: 66 %
Peak HR: 141 {beats}/min
Percent HR: 98 %
RATE: 0.4
RPE: 13
Rest HR: 80 {beats}/min
Rest Nuclear Isotope Dose: 10.3 mCi
SDS: 2
SRS: 7
SSS: 9
ST Depression (mm): 0 mm
Stress Nuclear Isotope Dose: 30 mCi
TID: 0.95

## 2022-06-19 MED ORDER — SODIUM CHLORIDE FLUSH 0.9 % IV SOLN
INTRAVENOUS | Status: AC
  Administered 2022-06-19: 10 mL via INTRAVENOUS
  Filled 2022-06-19: qty 10

## 2022-06-19 MED ORDER — TECHNETIUM TC 99M TETROFOSMIN IV KIT
30.0000 | PACK | Freq: Once | INTRAVENOUS | Status: AC | PRN
  Administered 2022-06-19: 30 via INTRAVENOUS

## 2022-06-19 MED ORDER — REGADENOSON 0.4 MG/5ML IV SOLN
INTRAVENOUS | Status: AC
  Filled 2022-06-19: qty 5

## 2022-06-19 MED ORDER — TECHNETIUM TC 99M TETROFOSMIN IV KIT
10.0000 | PACK | Freq: Once | INTRAVENOUS | Status: AC | PRN
  Administered 2022-06-19: 10.3 via INTRAVENOUS

## 2022-06-21 ENCOUNTER — Telehealth: Payer: Self-pay | Admitting: Cardiology

## 2022-06-21 ENCOUNTER — Encounter: Payer: Self-pay | Admitting: *Deleted

## 2022-06-21 ENCOUNTER — Telehealth: Payer: Self-pay | Admitting: *Deleted

## 2022-06-21 DIAGNOSIS — Z01810 Encounter for preprocedural cardiovascular examination: Secondary | ICD-10-CM

## 2022-06-21 DIAGNOSIS — R9439 Abnormal result of other cardiovascular function study: Secondary | ICD-10-CM

## 2022-06-21 DIAGNOSIS — R079 Chest pain, unspecified: Secondary | ICD-10-CM

## 2022-06-21 MED ORDER — ROSUVASTATIN CALCIUM 5 MG PO TABS: 5.0000 mg | ORAL_TABLET | Freq: Every day | ORAL | 3 refills | Status: AC

## 2022-06-21 NOTE — Telephone Encounter (Signed)
Left heart cath dx: exertional chest pain & abnormal stress test Thursday, June 27, 2022 with Dr. Tamala Julian '@9'$ :00 am  Instructions for heart cath read and explained to patient in detail. Also made available in mychart.

## 2022-06-21 NOTE — Telephone Encounter (Signed)
-----   Message from Satira Sark, MD sent at 06/19/2022  4:55 PM EDT ----- Received lab work from PCP office.  LDL is 88.  With suspected underlying ischemic heart disease, would suggest initiation of statin, would start Crestor 5 mg daily for now and we can titrate as needed.  Creatinine 1.22 as of May with GFR 61.  In light of fairly reassuring renal function, I would suggest that we consider proceeding with a diagnostic cardiac catheterization in light of his exertional symptoms and Myoview suggesting interval diagnosis of ischemic heart disease since previous assessment years ago.  Heart attack, stroke, and death risk with cardiac catheterization is generally less than 1% (1 in 1000 chance in the elective setting).  This can likely be accomplished via right radial approach.  We can get this set up if he is in agreement and I will place orders once more information is available.

## 2022-06-21 NOTE — Telephone Encounter (Signed)
Patient informed and verbalized understanding of plan. Copy sent to PCP 

## 2022-06-21 NOTE — Telephone Encounter (Signed)
-----   Message from Satira Sark, MD sent at 06/19/2022  2:37 PM EDT ----- Results reviewed.  Follow-up stress test is suggestive of moderate sized inferior infarct scar with normal LVEF at 66%.  In light of his recent reported symptoms, we may want to consider pursuing a follow-up diagnostic cardiac catheterization, however I really need to see his recent lab work from PCP to better understand his history of renal insufficiency.  Please obtain lab work and we can make plans from there.

## 2022-06-21 NOTE — Telephone Encounter (Signed)
PERCERT:  Left heart cath dx: exertional chest pain & abnormal stress test Thursday, June 27, 2022 with Dr. Tamala Julian '@9'$ :00 am

## 2022-06-21 NOTE — Progress Notes (Signed)
Left heart cath dx: exertional chest pain & abnormal stress test Thursday, June 27, 2022 with Dr. Tamala Julian '@9'$ :00 am

## 2022-06-23 ENCOUNTER — Other Ambulatory Visit: Payer: Self-pay | Admitting: Cardiology

## 2022-06-23 DIAGNOSIS — R9439 Abnormal result of other cardiovascular function study: Secondary | ICD-10-CM

## 2022-06-23 MED ORDER — SODIUM CHLORIDE 0.9% FLUSH: 3.0000 mL | Freq: Two times a day (BID) | INTRAVENOUS | Status: DC

## 2022-06-23 NOTE — Progress Notes (Signed)
Addendum to office note from June 18, 2022:  Results reviewed.  Follow-up stress test is suggestive of moderate sized inferior infarct scar with normal LVEF at 66%.  In light of his recent reported symptoms concerning for progressive angina, cardiac catheterization is recommended to assess for revascularization strategies.  Received lab work from PCP office.  LDL is 88.  With suspected underlying ischemic heart disease, would suggest initiation of statin, would start Crestor 5 mg daily for now and we can titrate as needed. Creatinine 1.22 as of May with GFR 61.  Heart attack, stroke, and death risk with cardiac catheterization is generally less than 1% (1 in 1000 chance in the elective setting).  This can likely be accomplished via right radial approach.  We can get this set up if he is in agreement and I will place orders once more information is available.  Satira Sark, M.D., F.A.C.C.

## 2022-06-24 DIAGNOSIS — Z01810 Encounter for preprocedural cardiovascular examination: Secondary | ICD-10-CM | POA: Diagnosis not present

## 2022-06-24 DIAGNOSIS — R079 Chest pain, unspecified: Secondary | ICD-10-CM | POA: Diagnosis not present

## 2022-06-24 DIAGNOSIS — R9439 Abnormal result of other cardiovascular function study: Secondary | ICD-10-CM | POA: Diagnosis not present

## 2022-06-25 ENCOUNTER — Telehealth: Payer: Self-pay | Admitting: *Deleted

## 2022-06-25 LAB — BASIC METABOLIC PANEL
BUN/Creatinine Ratio: 21 (ref 10–24)
BUN: 23 mg/dL (ref 8–27)
CO2: 22 mmol/L (ref 20–29)
Calcium: 9.1 mg/dL (ref 8.6–10.2)
Chloride: 105 mmol/L (ref 96–106)
Creatinine, Ser: 1.12 mg/dL (ref 0.76–1.27)
Glucose: 99 mg/dL (ref 70–99)
Potassium: 4.2 mmol/L (ref 3.5–5.2)
Sodium: 142 mmol/L (ref 134–144)
eGFR: 68 mL/min/{1.73_m2} (ref >59)

## 2022-06-25 LAB — CBC
Hematocrit: 38.9 % (ref 37.5–51.0)
Hemoglobin: 13.2 g/dL (ref 13.0–17.7)
MCH: 30.9 pg (ref 26.6–33.0)
MCHC: 33.9 g/dL (ref 31.5–35.7)
MCV: 91 fL (ref 79–97)
Platelets: 184 10*3/uL (ref 150–450)
RBC: 4.27 x10E6/uL (ref 4.14–5.80)
RDW: 11.5 % — ABNORMAL LOW (ref 11.6–15.4)
WBC: 7.3 10*3/uL (ref 3.4–10.8)

## 2022-06-25 NOTE — Telephone Encounter (Signed)
Cardiac Catheterization scheduled at [facility] for: Thursday June 27, 2022 9 AM Arrival time and place: [facility] Main Entrance A at: 7 AM  Nothing to eat after midnight prior to procedure, clear liquids until 5 AM day of procedure.  Medication instructions: -Usual morning medications can be taken with sips of water including aspirin 81 mg.  Confirmed patient has responsible adult to drive home post procedure and be with patient first 24 hours after arriving home.  Patient reports no new symptoms concerning for COVID-19 in the past 10 days.  Reviewed procedure instructions with patient.

## 2022-06-26 NOTE — H&P (Signed)
Abnormal Myoview. Possible inferior infarction but normal wall motion. LAHB and RBBB Sm

## 2022-06-27 ENCOUNTER — Encounter (HOSPITAL_COMMUNITY)
Admission: RE | Disposition: A | Discharge status: Home / Self Care | Payer: Self-pay | Source: Ambulatory Visit | Attending: Interventional Cardiology

## 2022-06-27 ENCOUNTER — Other Ambulatory Visit: Payer: Self-pay

## 2022-06-27 ENCOUNTER — Ambulatory Visit (HOSPITAL_COMMUNITY)
Admission: RE | Admit: 2022-06-27 | Discharge: 2022-06-27 | Disposition: A | Discharge status: Home / Self Care | Payer: Medicare Other | Source: Ambulatory Visit | Attending: Interventional Cardiology | Admitting: Interventional Cardiology

## 2022-06-27 DIAGNOSIS — I2510 Atherosclerotic heart disease of native coronary artery without angina pectoris: Secondary | ICD-10-CM | POA: Insufficient documentation

## 2022-06-27 DIAGNOSIS — R072 Precordial pain: Secondary | ICD-10-CM | POA: Diagnosis not present

## 2022-06-27 DIAGNOSIS — R0609 Other forms of dyspnea: Secondary | ICD-10-CM | POA: Insufficient documentation

## 2022-06-27 DIAGNOSIS — R9439 Abnormal result of other cardiovascular function study: Secondary | ICD-10-CM | POA: Insufficient documentation

## 2022-06-27 DIAGNOSIS — R0789 Other chest pain: Secondary | ICD-10-CM | POA: Insufficient documentation

## 2022-06-27 DIAGNOSIS — N1832 Chronic kidney disease, stage 3b: Secondary | ICD-10-CM | POA: Insufficient documentation

## 2022-06-27 DIAGNOSIS — E782 Mixed hyperlipidemia: Secondary | ICD-10-CM | POA: Insufficient documentation

## 2022-06-27 HISTORY — PX: LEFT HEART CATH AND CORONARY ANGIOGRAPHY: CATH118249

## 2022-06-27 HISTORY — PX: INTRAVASCULAR PRESSURE WIRE/FFR STUDY: CATH118243

## 2022-06-27 HISTORY — PX: CORONARY PRESSURE/FFR STUDY: CATH118243

## 2022-06-27 LAB — POCT ACTIVATED CLOTTING TIME: Activated Clotting Time: 287 seconds

## 2022-06-27 SURGERY — LEFT HEART CATH AND CORONARY ANGIOGRAPHY
Anesthesia: LOCAL

## 2022-06-27 MED ORDER — SODIUM CHLORIDE 0.9 % WEIGHT BASED INFUSION
3.0000 mL/kg/h | INTRAVENOUS | Status: AC
  Administered 2022-06-27: 3 mL/kg/h via INTRAVENOUS

## 2022-06-27 MED ORDER — LIDOCAINE HCL (PF) 1 % IJ SOLN
INTRAMUSCULAR | Status: DC | PRN
  Administered 2022-06-27: 2 mL

## 2022-06-27 MED ORDER — HEPARIN SODIUM (PORCINE) 1000 UNIT/ML IJ SOLN
INTRAMUSCULAR | Status: AC
  Filled 2022-06-27: qty 10

## 2022-06-27 MED ORDER — SODIUM CHLORIDE 0.9% FLUSH
3.0000 mL | INTRAVENOUS | Status: DC | PRN
  Administered 2022-06-27: 3 mL via INTRAVENOUS

## 2022-06-27 MED ORDER — ASPIRIN 81 MG PO CHEW: 81.0000 mg | CHEWABLE_TABLET | Freq: Every day | ORAL | Status: DC

## 2022-06-27 MED ORDER — HEPARIN (PORCINE) IN NACL 1000-0.9 UT/500ML-% IV SOLN
INTRAVENOUS | Status: DC | PRN
  Administered 2022-06-27 (×2): 500 mL

## 2022-06-27 MED ORDER — HEPARIN (PORCINE) IN NACL 1000-0.9 UT/500ML-% IV SOLN
INTRAVENOUS | Status: AC
  Filled 2022-06-27: qty 500

## 2022-06-27 MED ORDER — LIDOCAINE HCL (PF) 1 % IJ SOLN
INTRAMUSCULAR | Status: AC
  Filled 2022-06-27: qty 30

## 2022-06-27 MED ORDER — ADENOSINE (DIAGNOSTIC) 140MCG/KG/MIN
INTRAVENOUS | Status: DC | PRN
  Administered 2022-06-27: 140 ug/kg/min via INTRAVENOUS

## 2022-06-27 MED ORDER — LABETALOL HCL 5 MG/ML IV SOLN: 10.0000 mg | INTRAVENOUS | Status: DC | PRN

## 2022-06-27 MED ORDER — SODIUM CHLORIDE 0.9% FLUSH: 3.0000 mL | Freq: Two times a day (BID) | INTRAVENOUS | Status: DC

## 2022-06-27 MED ORDER — HYDRALAZINE HCL 20 MG/ML IJ SOLN: 10.0000 mg | INTRAMUSCULAR | Status: DC | PRN

## 2022-06-27 MED ORDER — MIDAZOLAM HCL 2 MG/2ML IJ SOLN
INTRAMUSCULAR | Status: AC
  Filled 2022-06-27: qty 2

## 2022-06-27 MED ORDER — SODIUM CHLORIDE 0.9 % IV SOLN: 250.0000 mL | INTRAVENOUS | Status: DC | PRN

## 2022-06-27 MED ORDER — VERAPAMIL HCL 2.5 MG/ML IV SOLN
INTRAVENOUS | Status: DC | PRN
  Administered 2022-06-27: 10 mL via INTRA_ARTERIAL

## 2022-06-27 MED ORDER — SODIUM CHLORIDE 0.9 % WEIGHT BASED INFUSION: 1.0000 mL/kg/h | INTRAVENOUS | Status: DC

## 2022-06-27 MED ORDER — VERAPAMIL HCL 2.5 MG/ML IV SOLN
INTRAVENOUS | Status: AC
  Filled 2022-06-27: qty 2

## 2022-06-27 MED ORDER — FENTANYL CITRATE (PF) 100 MCG/2ML IJ SOLN
INTRAMUSCULAR | Status: DC | PRN
  Administered 2022-06-27: 50 ug via INTRAVENOUS

## 2022-06-27 MED ORDER — FENTANYL CITRATE (PF) 100 MCG/2ML IJ SOLN
INTRAMUSCULAR | Status: AC
  Filled 2022-06-27: qty 2

## 2022-06-27 MED ORDER — MIDAZOLAM HCL 2 MG/2ML IJ SOLN
INTRAMUSCULAR | Status: DC | PRN
  Administered 2022-06-27 (×2): 1 mg via INTRAVENOUS

## 2022-06-27 MED ORDER — SODIUM CHLORIDE 0.9% FLUSH: 3.0000 mL | INTRAVENOUS | Status: DC | PRN

## 2022-06-27 MED ORDER — IOHEXOL 350 MG/ML SOLN
INTRAVENOUS | Status: DC | PRN
  Administered 2022-06-27: 67 mL

## 2022-06-27 MED ORDER — SODIUM CHLORIDE 0.9 % IV SOLN: INTRAVENOUS | Status: DC

## 2022-06-27 MED ORDER — ASPIRIN 81 MG PO CHEW
81.0000 mg | CHEWABLE_TABLET | ORAL | Status: AC
  Administered 2022-06-27: 81 mg via ORAL

## 2022-06-27 MED ORDER — HEPARIN SODIUM (PORCINE) 1000 UNIT/ML IJ SOLN
INTRAMUSCULAR | Status: DC | PRN
  Administered 2022-06-27: 5000 [IU] via INTRAVENOUS
  Administered 2022-06-27: 4000 [IU] via INTRAVENOUS

## 2022-06-27 MED ORDER — ONDANSETRON HCL 4 MG/2ML IJ SOLN: 4.0000 mg | Freq: Four times a day (QID) | INTRAMUSCULAR | Status: DC | PRN

## 2022-06-27 MED ORDER — ADENOSINE 12 MG/4ML IV SOLN
INTRAVENOUS | Status: AC
  Filled 2022-06-27: qty 4

## 2022-06-27 SURGICAL SUPPLY — 14 items
BAND CMPR LRG ZPHR (HEMOSTASIS) ×1
BAND ZEPHYR COMPRESS 30 LONG (HEMOSTASIS) IMPLANT
CATH 5FR JL3.5 JR4 ANG PIG MP (CATHETERS) IMPLANT
CATH VISTA GUIDE 6FR XBLAD3.5 (CATHETERS) IMPLANT
GLIDESHEATH SLEND A-KIT 6F 22G (SHEATH) IMPLANT
GUIDEWIRE INQWIRE 1.5J.035X260 (WIRE) IMPLANT
GUIDEWIRE PRESSURE X 175 (WIRE) IMPLANT
INQWIRE 1.5J .035X260CM (WIRE) ×1
KIT ESSENTIALS PG (KITS) IMPLANT
KIT HEART LEFT (KITS) ×1 IMPLANT
PACK CARDIAC CATHETERIZATION (CUSTOM PROCEDURE TRAY) ×1 IMPLANT
SET ATX SIMPLICITY (MISCELLANEOUS) IMPLANT
SHEATH PROBE COVER 6X72 (BAG) IMPLANT
TUBING CIL FLEX 10 FLL-RA (TUBING) ×1 IMPLANT

## 2022-06-27 NOTE — Discharge Instructions (Signed)

## 2022-06-27 NOTE — Progress Notes (Signed)
TR BAND REMOVAL  LOCATION:    right radial  DEFLATED PER PROTOCOL:    Yes.    TIME BAND OFF / DRESSING APPLIED:    1206 gauze dressing applied   SITE UPON ARRIVAL:    Level 0  SITE AFTER BAND REMOVAL:    Level 0  CIRCULATION SENSATION AND MOVEMENT:    Within Normal Limits   Yes.    COMMENTS:   no additional issues noted

## 2022-06-27 NOTE — Progress Notes (Signed)
Removed 3cc of air from TR band. ~ 20sec later pt bled from site 3cc of air returned to band to stop bleeding. Bleed stopped immediately after air returned. Will resume air removal at 1045 and will continue to monitor. Joylene Grapes

## 2022-06-27 NOTE — Interval H&P Note (Signed)
Cath Lab Visit (complete for each Cath Lab visit)  Clinical Evaluation Leading to the Procedure:   ACS: No.  Non-ACS:    Anginal Classification: CCS II  Anti-ischemic medical therapy: Minimal Therapy (1 class of medications)  Non-Invasive Test Results: Intermediate-risk stress test findings: cardiac mortality 1-3%/year  Prior CABG: No previous CABG      History and Physical Interval Note:  06/27/2022 8:21 AM  Myocardial perfusion study demonstrated  inferior infarction --> cardiac cath.  Kurt Duke  has presented today for surgery, with the diagnosis of exertional chest pain, abnormal stress test.  The various methods of treatment have been discussed with the patient and family. After consideration of risks, benefits and other options for treatment, the patient has consented to  Procedure(s): LEFT HEART CATH AND CORONARY ANGIOGRAPHY (N/A) as a surgical intervention.  The patient's history has been reviewed, patient examined, no change in status, stable for surgery.  I have reviewed the patient's chart and labs.  Questions were answered to the patient's satisfaction.     Belva Crome III

## 2022-06-27 NOTE — CV Procedure (Signed)
No obstructive coronary disease Normal LV function and LVEDP.  EF 60%. Microvascular dysfunction with IMR 57 (normal less than 25) and CFR 2.3 (normal greater than 2.0).  This data is consistent with angina with none obstructive coronary arteries (ANOCA). Recommend ARB therapy and either beta-blocker or negative inotropic calcium channel blocker.

## 2022-06-28 ENCOUNTER — Encounter (HOSPITAL_COMMUNITY): Payer: Self-pay | Admitting: Interventional Cardiology

## 2022-07-04 ENCOUNTER — Telehealth: Payer: Self-pay | Admitting: Cardiology

## 2022-07-04 NOTE — Telephone Encounter (Signed)
Patient is requesting to discuss any pos op restrictions/instructions. He states he has been advised to follow up with Dr. Domenic Polite for follow up as well.

## 2022-07-04 NOTE — Telephone Encounter (Signed)
Asking how long he should wait before lifting more than 10 lbs Advised after 1 week he should be okay with lifting more than 10 lbs Gave follow up appointment to see Estella Husk 08/05/22 '@1'$ :00 pm [place] office Verbalized understanding

## 2022-07-10 DIAGNOSIS — L570 Actinic keratosis: Secondary | ICD-10-CM | POA: Diagnosis not present

## 2022-07-10 DIAGNOSIS — Z85828 Personal history of other malignant neoplasm of skin: Secondary | ICD-10-CM | POA: Diagnosis not present

## 2022-07-30 NOTE — Progress Notes (Signed)
Cardiology Office Note:    Date:  08/05/2022   ID:  Kurt Duke, DOB [birth year], MRN [id number]  PCP:  Glenda Chroman, MD  [name] Cardiologist:  Rozann Lesches, MD     Referring MD: Glenda Chroman, MD   Chief Complaint:  Hospitalization Follow-up     History of Present Illness:   Kurt Duke is a 76 y.o. male with  history of chest pain, mild nonobstructive CAD on cath 2011, HLD, normal LVEF 55-60% on echo.  Patient saw Dr. Domenic Polite 06/18/22 with recurrent chest pain. He had cath by  Dr. Tamala Julian 06/27/22-no obstructive CAD, microvascular dysfunction consistent with ANOCA (angina with none obstructive coronary artery obstruction.  Recommend ARB therapy and either beta-blocker or negative inotropic calcium channel blocker.     Patient comes in for f/u. Trying to exercise-walking 5000 steps 3 days a week and pickleball twice a week.  Under stress with his wife going through cancer treatment with chemo and radiation. He feels like stress caused a lot of his chest pain. The first time he played pickleball he had some chest pain and had to stop. None since. HR 110/m but patient asymptomatic.    Past Medical History:  Diagnosis Date   Cancer ([place])    Skin   CKD (chronic kidney disease) stage 3, GFR 30-59 ml/min (HCC)    COVID-19    Degenerative disc disease, cervical    GERD (gastroesophageal reflux disease)    History of varicose veins    Insomnia    Low back pain    Mixed hyperlipidemia    Spinal stenosis    Current Medications: Current Meds  Medication Sig   acetaminophen (TYLENOL) 500 MG tablet Take 500 mg by mouth 3 (three) times daily as needed for moderate pain or headache.   aspirin EC 81 MG tablet Take 81 mg by mouth daily. Swallow whole.   diclofenac (VOLTAREN) 75 MG EC tablet Take 75 mg by mouth 2 (two) times daily.   ibuprofen (ADVIL) 200 MG tablet Take 200 mg by mouth 2 (two) times daily as needed for moderate pain.   loratadine (CLARITIN) 10 MG  tablet Take 10 mg by mouth daily.   Menthol-Methyl Salicylate (MUSCLE RUB EX) Apply 1 application topically daily as needed (pain).   metoprolol succinate (TOPROL XL) 25 MG 24 hr tablet Take 1 tablet (25 mg total) by mouth daily.   rosuvastatin (CRESTOR) 5 MG tablet Take 1 tablet (5 mg total) by mouth daily.   traMADol (ULTRAM) 50 MG tablet Take 50 mg by mouth 3 (three) times daily as needed for moderate pain.    zolpidem (AMBIEN) 10 MG tablet Take 10 mg by mouth at bedtime.   Current Facility-Administered Medications for the 08/05/22 encounter (Office Visit) with [name], PA-C  Medication   sodium chloride flush (NS) 0.9 % injection 3 mL    Allergies:   Vicodin [hydrocodone-acetaminophen] and Doxycycline   Social History   Tobacco Use   Smoking status: Never   Smokeless tobacco: Never  Vaping Use   Vaping Use: Never used  Substance Use Topics   Alcohol use: Yes    Alcohol/week: 1.0 standard drink of alcohol    Types: 1 Cans of beer per week   Drug use: Never    Family Hx: The patient's family history includes Alzheimer's disease in his mother.  ROS     Physical Exam:    VS:  BP 110/78   Pulse (!) 110  Ht '5\' 11"'$  (1.803 m)   Wt 182 lb (82.6 kg)   SpO2 95%   BMI 25.38 kg/m     Wt Readings from Last 3 Encounters:  08/05/22 182 lb (82.6 kg)  06/27/22 175 lb (79.4 kg)  06/18/22 180 lb 9.6 oz (81.9 kg)    Physical Exam  GEN: Well nourished, well developed, in no acute distress  Neck: no JVD, carotid bruits, or masses Cardiac:RRR; no murmurs, rubs, or gallops  Respiratory:  clear to auscultation bilaterally, normal work of breathing GI: soft, nontender, nondistended, + BS Ext: without cyanosis, clubbing, or edema, Good distal pulses bilaterally Neuro:  Alert and Oriented x 3,  Psych: euthymic mood, full affect        EKGs/Labs/Other Test Reviewed:    EKG:  EKG is  ordered today.  The ekg ordered today demonstrates sinus tachycardia 109/m RBBB, LAFB,  bifasicular block. No change other than tachycardia.  Recent Labs: 06/24/2022: BUN 23; Creatinine, Ser 1.12; Hemoglobin 13.2; Platelets 184; Potassium 4.2; Sodium 142   Recent Lipid Panel No results for input(s): "CHOL", "TRIG", "HDL", "VLDL", "LDLCALC", "LDLDIRECT" in the last 8760 hours.   Prior CV Studies:   Cath 06/27/22 No obstructive coronary disease Normal LV function and LVEDP.  EF 60%. Microvascular dysfunction with IMR 57 (normal less than 25) and CFR 2.3 (normal greater than 2.0).  This data is consistent with angina with none obstructive coronary arteries (ANOCA). Recommend ARB therapy and either beta-blocker or negative inotropic calcium channel blocker Echocardiogram 04/29/2022 [facility]): Normal LV chamber size and wall thickness with LVEF 55 to 60%, impaired diastolic relaxation pattern, normal aortic root size, trace aortic regurgitation, trace tricuspid regurgitation, no pericardial effusion.    Risk Assessment/Calculations/Metrics:              ASSESSMENT & PLAN:   No problem-specific Assessment & Plan notes found for this encounter.   Chest pain cath by  Dr. Tamala Julian 06/27/22-no obstructive CAD, microvascular dysfunction consistent with ANOCA (angina with none obstructive coronary artery obstruction.  Recommend ARB therapy and either beta-blocker or negative inotropic calcium channel blocker.  Patient never was started on anything. He's had some chest pain but not frequent. Sinus tachycardia at rest today. Will start low dose Toprol XL 25 mg once daily. I'll see him back in 4-6 weeks to see how he's tolerating it.   Sinus tachycardia at rest. HR 110/m on arrival and still up on EKG. Asymptomatic. Will start BB.  HLD on crestor-check FLP  CKD stage 3b Crt 1.12 05/2022  RBBB            Dispo:  No follow-ups on file.   Medication Adjustments/Labs and Tests Ordered: Current medicines are reviewed at length with the patient today.  Concerns regarding  medicines are outlined above.  Tests Ordered: Orders Placed This Encounter  Procedures   Lipid panel   EKG 12-Lead   Medication Changes: Meds ordered this encounter  Medications   metoprolol succinate (TOPROL XL) 25 MG 24 hr tablet    Sig: Take 1 tablet (25 mg total) by mouth daily.    Dispense:  90 tablet    Refill:  3   Signed, Ermalinda Barrios, PA-C  08/05/2022 1:23 PM    [address], [place],   [address] Phone: [phone]; Fax: [phone]

## 2022-07-31 ENCOUNTER — Ambulatory Visit: Payer: Medicare Other | Admitting: Physician Assistant

## 2022-08-05 ENCOUNTER — Encounter: Payer: Self-pay | Admitting: Physician Assistant

## 2022-08-05 ENCOUNTER — Ambulatory Visit: Payer: Medicare Other | Attending: Physician Assistant | Admitting: Physician Assistant

## 2022-08-05 VITALS — BP 110/78 | HR 110 | Ht 71.0 in | Wt 182.0 lb

## 2022-08-05 DIAGNOSIS — G4700 Insomnia, unspecified: Secondary | ICD-10-CM | POA: Diagnosis not present

## 2022-08-05 DIAGNOSIS — E7849 Other hyperlipidemia: Secondary | ICD-10-CM

## 2022-08-05 DIAGNOSIS — I4510 Unspecified right bundle-branch block: Secondary | ICD-10-CM

## 2022-08-05 DIAGNOSIS — R000 Tachycardia, unspecified: Secondary | ICD-10-CM | POA: Diagnosis not present

## 2022-08-05 DIAGNOSIS — N1832 Chronic kidney disease, stage 3b: Secondary | ICD-10-CM

## 2022-08-05 DIAGNOSIS — M545 Low back pain, unspecified: Secondary | ICD-10-CM | POA: Diagnosis not present

## 2022-08-05 DIAGNOSIS — I2089 Other forms of angina pectoris: Secondary | ICD-10-CM

## 2022-08-05 DIAGNOSIS — Z299 Encounter for prophylactic measures, unspecified: Secondary | ICD-10-CM | POA: Diagnosis not present

## 2022-08-05 MED ORDER — METOPROLOL SUCCINATE ER 25 MG PO TB24: 25.0000 mg | ORAL_TABLET | Freq: Every day | ORAL | 3 refills | Status: DC

## 2022-08-05 NOTE — Patient Instructions (Addendum)
Medication Instructions:   Start Taking Toprol XL 25 mg Daily   *If you need a refill on your cardiac medications before your next appointment, please call your pharmacy*   Lab Work: Your physician recommends that you return for lab work : Fasting    If you have labs (blood work) drawn today and your tests are completely normal, you will receive your results only by: [place] (if you have MyChart) OR A paper copy in the mail If you have any lab test that is abnormal or we need to change your treatment, we will call you to review the results.   Testing/Procedures: NONE    Follow-Up: At [facility], you and your health needs are our priority.  As part of our continuing mission to provide you with exceptional heart care, we have created designated Provider Care Teams.  These Care Teams include your primary Cardiologist (physician) and Advanced Practice Providers (APPs -  Physician Assistants and Nurse Practitioners) who all work together to provide you with the care you need, when you need it.  We recommend signing up for the patient portal called "MyChart".  Sign up information is provided on this After Visit Summary.  MyChart is used to connect with patients for Virtual Visits (Telemedicine).  Patients are able to view lab/test results, encounter notes, upcoming appointments, etc.  Non-urgent messages can be sent to your provider as well.   To learn more about what you can do with MyChart, go to [email or web address].    Your next appointment:   1 month(s)  The format for your next appointment:   In Person  Provider:   You may see Rozann Lesches, MD or one of the following Advanced Practice Providers on your designated Care Team:   Bernerd Pho, PA-C  Ermalinda Barrios, PA-C     Other Instructions Thank you for choosing [facility]!    Important Information About Sugar

## 2022-08-27 DIAGNOSIS — M170 Bilateral primary osteoarthritis of knee: Secondary | ICD-10-CM | POA: Diagnosis not present

## 2022-09-19 NOTE — Progress Notes (Signed)
Cardiology Office Note  Date: 09/20/2022   ID: Kurt, Duke 10-02-1945, MRN [id number]  PCP:  Glenda Chroman, MD  Cardiologist:  Rozann Lesches, MD Electrophysiologist:  None   Chief Complaint  Patient presents with   Cardiac follow-up    History of Present Illness: Kurt Duke is a 76 y.o. male last seen in November by Ms. Vita Barley, I reviewed the note.  He is here for a follow-up visit.  We discussed the results of his cardiac catheterization as detailed below.  He had no evidence of obstructive CAD but microvascular dysfunction.  He was started on Toprol-XL at the last visit, did not tolerate this due to fatigue, also relatively low blood pressure and heart rate.  He is also stopped Crestor due to myalgias.  He took it for about 60 days.  He does not report any progressive exertional symptoms at this time.  We discussed regular exercise, we will try to reinstitute Crestor a few days a week and see how he does.  He is otherwise on low-dose aspirin.  Continue to track blood pressure, not certain that he would tolerate even low-dose ARB but this would be the next step.  Past Medical History:  Diagnosis Date   Cancer ([place])    Skin   CKD (chronic kidney disease) stage 3, GFR 30-59 ml/min (HCC)    COVID-19    Degenerative disc disease, cervical    GERD (gastroesophageal reflux disease)    History of varicose veins    Insomnia    Low back pain    Mixed hyperlipidemia    Spinal stenosis     Past Surgical History:  Procedure Laterality Date   APPENDECTOMY     CARDIAC CATHETERIZATION     2010   CHOLECYSTECTOMY     COLONOSCOPY     2009   COLONOSCOPY N/A 03/10/2019   Procedure: COLONOSCOPY;  Surgeon: Rogene Houston, MD;  [facility];  Service: Endoscopy;  Laterality: N/A;  1030   INGUINAL HERNIA REPAIR     Bilateral   INTRAVASCULAR PRESSURE WIRE/FFR STUDY N/A 06/27/2022   Procedure: INTRAVASCULAR PRESSURE WIRE/FFR STUDY;  Surgeon: Belva Crome, MD;   Location: [facility];  Service: Cardiovascular;  Laterality: N/A;   Knee surgeries     x 4   LEFT HEART CATH AND CORONARY ANGIOGRAPHY N/A 06/27/2022   Procedure: LEFT HEART CATH AND CORONARY ANGIOGRAPHY;  Surgeon: Belva Crome, MD;  Location: [facility] CV LAB;  Service: Cardiovascular;  Laterality: N/A;   POLYPECTOMY  03/10/2019   Procedure: POLYPECTOMY;  Surgeon: Rogene Houston, MD;  Location[facility];  Service: Endoscopy;;   VEIN LIGATION AND STRIPPING      Current Outpatient Medications  Medication Sig Dispense Refill   acetaminophen (TYLENOL) 500 MG tablet Take 500 mg by mouth 3 (three) times daily as needed for moderate pain or headache.     aspirin EC 81 MG tablet Take 81 mg by mouth daily. Swallow whole.     ibuprofen (ADVIL) 200 MG tablet Take 200 mg by mouth 2 (two) times daily as needed for moderate pain.     loratadine (CLARITIN) 10 MG tablet Take 10 mg by mouth daily.     Menthol-Methyl Salicylate (MUSCLE RUB EX) Apply 1 application topically daily as needed (pain).     rosuvastatin (CRESTOR) 5 MG tablet Take 1 tablet (5 mg total) by mouth daily. (Patient taking differently: Take 5 mg by mouth 2 (  two) times a week.) 90 tablet 3   traMADol (ULTRAM) 50 MG tablet Take 50 mg by mouth 3 (three) times daily as needed for moderate pain.      zolpidem (AMBIEN) 10 MG tablet Take 10 mg by mouth at bedtime.     diclofenac (VOLTAREN) 75 MG EC tablet Take 75 mg by mouth 2 (two) times daily. (Patient not taking: Reported on 09/20/2022)     Current Facility-Administered Medications  Medication Dose Route Frequency Provider Last Rate Last Admin   sodium chloride flush (NS) 0.9 % injection 3 mL  3 mL Intravenous Q12H Satira Sark, MD       Allergies:  Vicodin [hydrocodone-acetaminophen] and Doxycycline   ROS: No palpitations or syncope.  Physical Exam: VS:  BP 128/88   Pulse 86   Ht '5\' 11"'$  (1.803 m)   Wt 182 lb (82.6 kg)   SpO2 99%   BMI 25.38 kg/m , BMI Body  mass index is 25.38 kg/m.  Wt Readings from Last 3 Encounters:  09/20/22 182 lb (82.6 kg)  08/05/22 182 lb (82.6 kg)  06/27/22 175 lb (79.4 kg)    General: Patient appears comfortable at rest. HEENT: Conjunctiva and lids normal. Neck: Supple, no elevated JVP or carotid bruits. Lungs: Clear to auscultation, nonlabored breathing at rest. Cardiac: Regular rate and rhythm, no S3 or significant systolic murmur. Extremities: No pitting edema.  ECG:  An ECG dated 08/05/2022 was personally reviewed today and demonstrated:  Sinus tachycardia with right bundle branch block and left anterior fascicular block.  Recent Labwork: May 2023: Cholesterol 175, triglycerides 64, HDL 75, LDL 88 06/24/2022: BUN 23; Creatinine, Ser 1.12; Hemoglobin 13.2; Platelets 184; Potassium 4.2; Sodium 142   Other Studies Reviewed Today:  Echocardiogram 04/29/2022 [facility]): Normal LV chamber size and wall thickness with LVEF 55 to 60%, impaired diastolic relaxation pattern, normal aortic root size, trace aortic regurgitation, trace tricuspid regurgitation, no pericardial effusion.  Cardiac catheterization 06/27/2022: CONCLUSIONS: Microvascular dysfunction with CFR 2.3 and IMR (index of myocardial resistance) 57 (normal less than 25). Right dominant coronary anatomy.  Normal right coronary. Left coronary system is widely patent with luminal irregularities noted in the mid LAD. Normal LV systolic function.  EF greater than 55%.  LVEDP is normal.   RECOMMENDATIONS:   Aggressive risk factor modification. Start ARB therapy and consider adding either beta-blocker or negative inotropic calcium channel blocker. Defect on nuclear imaging likely related to soft tissue attenuation.  Assessment and Plan:  1.  Microvascular angina based on cardiac catheterization from September.  LVEF normal at 55 to 60%.  He did not tolerate Toprol-XL due to fatigue, low heart rate and blood pressure per home checks.  Plan to  continue low-dose aspirin and low-dose statin therapy.  We discussed a regular exercise plan as well.  Try and reinstitute Crestor 5 mg twice weekly.  We will recheck lipid panel at next visit if he tolerates this.  Could consider addition of low-dose ARB depending on his baseline blood pressure, however per review of his recent outpatient numbers I doubt he would tolerate this right now.  2.  Mixed hyperlipidemia with statin myalgias.  Attempting Crestor 5 mg twice weekly for now.  Medication Adjustments/Labs and Tests Ordered: Current medicines are reviewed at length with the patient today.  Concerns regarding medicines are outlined above.   Tests Ordered: Orders Placed This Encounter  Procedures   Lipid Profile    Medication Changes: No orders of the defined types were placed  in this encounter.   Disposition:  Follow up  6 months.  Signed, Satira Sark, MD, [facility] 09/20/2022 9:34 AM    [facility] [place]. [address] [place], [place] [address] Phone: [phone]; Fax: [phone]

## 2022-09-20 ENCOUNTER — Ambulatory Visit: Payer: Medicare Other | Attending: Cardiology | Admitting: Cardiology

## 2022-09-20 ENCOUNTER — Encounter: Payer: Self-pay | Admitting: Cardiology

## 2022-09-20 VITALS — BP 128/88 | HR 86 | Ht 71.0 in | Wt 182.0 lb

## 2022-09-20 DIAGNOSIS — E782 Mixed hyperlipidemia: Secondary | ICD-10-CM | POA: Diagnosis not present

## 2022-09-20 DIAGNOSIS — E7849 Other hyperlipidemia: Secondary | ICD-10-CM | POA: Diagnosis not present

## 2022-09-20 DIAGNOSIS — I2089 Other forms of angina pectoris: Secondary | ICD-10-CM | POA: Diagnosis not present

## 2022-09-20 NOTE — Patient Instructions (Signed)
Medication Instructions:  STOP Metoprolol   Take Crestor 2 times a week  Labwork: Lipids in 6 months  Testing/Procedures: None today  Follow-Up: 6 months  Any Other Special Instructions Will Be Listed Below (If Applicable).  If you need a refill on your cardiac medications before your next appointment, please call your pharmacy.

## 2022-11-26 DIAGNOSIS — M25511 Pain in right shoulder: Secondary | ICD-10-CM | POA: Diagnosis not present

## 2022-11-26 DIAGNOSIS — M25562 Pain in left knee: Secondary | ICD-10-CM | POA: Diagnosis not present

## 2022-12-25 DIAGNOSIS — M2560 Stiffness of unspecified joint, not elsewhere classified: Secondary | ICD-10-CM | POA: Diagnosis not present

## 2022-12-25 DIAGNOSIS — Z299 Encounter for prophylactic measures, unspecified: Secondary | ICD-10-CM | POA: Diagnosis not present

## 2022-12-25 DIAGNOSIS — M545 Low back pain, unspecified: Secondary | ICD-10-CM | POA: Diagnosis not present

## 2023-02-14 DIAGNOSIS — G4700 Insomnia, unspecified: Secondary | ICD-10-CM | POA: Diagnosis not present

## 2023-02-14 DIAGNOSIS — Z299 Encounter for prophylactic measures, unspecified: Secondary | ICD-10-CM | POA: Diagnosis not present

## 2023-02-14 DIAGNOSIS — Z7189 Other specified counseling: Secondary | ICD-10-CM | POA: Diagnosis not present

## 2023-02-14 DIAGNOSIS — D692 Other nonthrombocytopenic purpura: Secondary | ICD-10-CM | POA: Diagnosis not present

## 2023-02-14 DIAGNOSIS — Z0000 Encounter for general adult medical examination without abnormal findings: Secondary | ICD-10-CM | POA: Diagnosis not present

## 2023-03-28 DIAGNOSIS — E782 Mixed hyperlipidemia: Secondary | ICD-10-CM | POA: Diagnosis not present

## 2023-03-29 LAB — LIPID PANEL
Chol/HDL Ratio: 2.1 ratio (ref 0.0–5.0)
Cholesterol, Total: 139 mg/dL (ref 100–199)
HDL: 65 mg/dL (ref >39)
LDL Chol Calc (NIH): 61 mg/dL (ref 0–99)
Triglycerides: 63 mg/dL (ref 0–149)
VLDL Cholesterol Cal: 13 mg/dL (ref 5–40)

## 2023-04-01 NOTE — Progress Notes (Unsigned)
    Cardiology Office Note  Date: 04/02/2023   ID: Kurt Duke, DOB 1946/05/18, MRN [id number]  History of Present Illness: Kurt Duke is a 77 y.o. male last seen in December 2023.  He is here for a routine visit.  Reports no accelerating angina or change in stamina.  He is playing pickle ball 3 days a week.  I reviewed his medications, he continues on aspirin and has been tolerating Crestor 5 mg twice weekly.  LDL came down nicely to 61 by recent lab work.  Physical Exam: VS:  BP 118/80   Pulse 72   Ht 5\' 11"  (1.803 m)   Wt 186 lb 3.2 oz (84.5 kg)   SpO2 97%   BMI 25.97 kg/m , BMI Body mass index is 25.97 kg/m.  Wt Readings from Last 3 Encounters:  04/02/23 186 lb 3.2 oz (84.5 kg)  09/20/22 182 lb (82.6 kg)  08/05/22 182 lb (82.6 kg)    General: Patient appears comfortable at rest. HEENT: Conjunctiva and lids normal. Neck: Supple, no elevated JVP or carotid bruits. Lungs: Clear to auscultation, nonlabored breathing at rest. Cardiac: Regular rate and rhythm, no S3 or significant systolic murmur. Extremities: No pitting edema.  ECG:  An ECG dated 08/05/2022 was personally reviewed today and demonstrated:  Sinus tachycardia with right bundle branch block and left anterior fascicular block.  Labwork: 06/24/2022: BUN 23; Creatinine, Ser 1.12; Hemoglobin 13.2; Platelets 184; Potassium 4.2; Sodium 142     Component Value Date/Time   CHOL 139 03/28/2023 0949   TRIG 63 03/28/2023 0949   HDL 65 03/28/2023 0949   CHOLHDL 2.1 03/28/2023 0949   CHOLHDL 2.7 04/17/2010 0534   VLDL 14 04/17/2010 0534   LDLCALC 61 03/28/2023 0949   Other Studies Reviewed Today:  No interval cardiac testing for review today.  Assessment and Plan:  1.  Microvascular angina with cardiac catheterization in September 2023 showing no significant obstructive CAD.  Plan to continue regular exercise, also on aspirin and low-dose statin therapy.  He is symptomatically stable at this time.  2.  Mixed  hyperlipidemia, on low-dose Crestor.  Recent LDL 61.  Disposition:  Follow up  1 year.  Signed, Jonelle Sidle, M.D., F.A.C.C. [facility]

## 2023-04-02 ENCOUNTER — Encounter: Payer: Self-pay | Admitting: Cardiology

## 2023-04-02 ENCOUNTER — Ambulatory Visit: Payer: Medicare Other | Attending: Cardiology | Admitting: Cardiology

## 2023-04-02 VITALS — BP 118/80 | HR 72 | Ht 71.0 in | Wt 186.2 lb

## 2023-04-02 DIAGNOSIS — I2089 Other forms of angina pectoris: Secondary | ICD-10-CM | POA: Diagnosis not present

## 2023-04-02 DIAGNOSIS — E782 Mixed hyperlipidemia: Secondary | ICD-10-CM

## 2023-04-02 NOTE — Patient Instructions (Signed)

## 2023-04-14 DIAGNOSIS — Z299 Encounter for prophylactic measures, unspecified: Secondary | ICD-10-CM | POA: Diagnosis not present

## 2023-04-14 DIAGNOSIS — Z0000 Encounter for general adult medical examination without abnormal findings: Secondary | ICD-10-CM | POA: Diagnosis not present

## 2023-04-14 DIAGNOSIS — I10 Essential (primary) hypertension: Secondary | ICD-10-CM | POA: Diagnosis not present

## 2023-04-14 DIAGNOSIS — M25551 Pain in right hip: Secondary | ICD-10-CM | POA: Diagnosis not present

## 2023-04-14 DIAGNOSIS — Z79899 Other long term (current) drug therapy: Secondary | ICD-10-CM | POA: Diagnosis not present

## 2023-04-14 DIAGNOSIS — R5383 Other fatigue: Secondary | ICD-10-CM | POA: Diagnosis not present

## 2023-04-14 DIAGNOSIS — E7800 Pure hypercholesterolemia, unspecified: Secondary | ICD-10-CM | POA: Diagnosis not present

## 2023-04-28 DIAGNOSIS — H524 Presbyopia: Secondary | ICD-10-CM | POA: Diagnosis not present

## 2023-04-28 DIAGNOSIS — H353122 Nonexudative age-related macular degeneration, left eye, intermediate dry stage: Secondary | ICD-10-CM | POA: Diagnosis not present

## 2023-05-26 DIAGNOSIS — M25551 Pain in right hip: Secondary | ICD-10-CM | POA: Diagnosis not present

## 2023-05-29 ENCOUNTER — Telehealth: Payer: Self-pay | Admitting: Cardiology

## 2023-05-29 NOTE — Telephone Encounter (Signed)
   Pre-operative Risk Assessment    Patient Name: Kurt Duke  DOB: [birth year] MRN: [id number]      Request for Surgical Clearance    Procedure:   Right Total Hip replacement   Date of Surgery:  Clearance TBD                               Surgeon: Margarita Rana, MD  Surgeon's Group or Practice Name:  [facility] Phone number:  [phone]  ext [phone] attn:Kelly High   Fax number:  [phone]   Type of Clearance Requested:   - Medical    Type of Anesthesia:  Spinal   Additional requests/questions:    [name]   05/29/2023, 4:30 PM

## 2023-05-30 NOTE — Telephone Encounter (Signed)
   Name: Kurt Duke  DOB: 1946-08-09  MRN: [id number]   Primary Cardiologist: Nona Dell, MD  Chart reviewed as part of pre-operative protocol coverage. Patient was contacted 05/30/2023 in reference to pre-operative risk assessment for pending surgery as outlined below.  [name] was last seen on 04/02/2023 by Dr. Diona Browner.  Since that day, [name] has done well from a cardiac standpoint. He denies any new symptoms or concerns. He is able to complete > 4 METS without difficulty.    Therefore, based on ACC/AHA guidelines, the patient would be at acceptable risk for the planned procedure without further cardiovascular testing.   The patient was advised that if he develops new symptoms prior to surgery to contact our office to arrange for a follow-up visit, and he verbalized understanding.  Per office protocol, he may hold Aspirin for 5-7 days prior to procedure. Please resume Aspirin as soon as possible postprocedure, at the discretion of the surgeon.   I will route this recommendation to the requesting party via Epic fax function and remove from pre-op pool. Please call with questions.  Joylene Grapes, NP 05/30/2023, 11:42 AM

## 2023-06-03 DIAGNOSIS — D696 Thrombocytopenia, unspecified: Secondary | ICD-10-CM | POA: Diagnosis not present

## 2023-06-03 DIAGNOSIS — Z01818 Encounter for other preprocedural examination: Secondary | ICD-10-CM | POA: Diagnosis not present

## 2023-06-03 DIAGNOSIS — Z299 Encounter for prophylactic measures, unspecified: Secondary | ICD-10-CM | POA: Diagnosis not present

## 2023-06-03 DIAGNOSIS — M1611 Unilateral primary osteoarthritis, right hip: Secondary | ICD-10-CM | POA: Diagnosis not present

## 2023-06-23 DIAGNOSIS — M25551 Pain in right hip: Secondary | ICD-10-CM | POA: Diagnosis not present

## 2023-06-25 NOTE — Progress Notes (Signed)
Surgery orders requested via Epic inbox. °

## 2023-07-01 NOTE — Progress Notes (Signed)
Anesthesia Review:  PCP: Cardiologist : Simona Huh- LOV 04/02/23  05/29/23- telephone preop clearance  Chest x-ray : EKG  08/05/22  Echo  04/29/22  Stress test: 06/19/22  Cardiac Cath :  06/27/22  Activity level:  Sleep Study/ CPAP : Fasting Blood Sugar :      / Checks Blood Sugar -- times a day:   Blood Thinner/ Instructions /Last Dose: ASA / Instructions/ Last Dose :    81 mg aspirin

## 2023-07-01 NOTE — Patient Instructions (Signed)
SURGICAL WAITING ROOM VISITATION  Patients having surgery or a procedure may have no more than 2 support people in the waiting area - these visitors may rotate.    Children under the age of 3 must have an adult with them who is not the patient.  Due to an increase in RSV and influenza rates and associated hospitalizations, children ages 23 and under may not visit patients in [facility] hospitals.  If the patient needs to stay at the hospital during part of their recovery, the visitor guidelines for inpatient rooms apply. Pre-op nurse will coordinate an appropriate time for 1 support person to accompany patient in pre-op.  This support person may not rotate.    Please refer to the [facility] website for the visitor guidelines for Inpatients (after your surgery is over and you are in a regular room).       Your procedure is scheduled on:  07/15/2023    Report to [facility] Main Entrance    Report to admitting at   0600AM   Call this number if you have problems the morning of surgery [phone]   Do not eat food :After Midnight.   After Midnight you may have the following liquids until _ 0530_____ AM OF SURGERY  Water Non-Citrus Juices (without pulp, NO RED-Apple, White grape, White cranberry) Black Coffee (NO MILK/CREAM OR CREAMERS, sugar ok)  Clear Tea (NO MILK/CREAM OR CREAMERS, sugar ok) regular and decaf                             Plain Jell-O (NO RED)                                           Fruit ices (not with fruit pulp, NO RED)                                     Popsicles (NO RED)                                                               Sports drinks like Gatorade (NO RED)                    The day of surgery:  Drink ONE (1) Pre-Surgery Clear Ensure or G2 at 0530 AM  ( have completed by ) the morning of surgery. Drink in one sitting. Do not sip.  This drink was given to you during your hospital  pre-op appointment visit. Nothing else to drink  after completing the  Pre-Surgery Clear Ensure or G2.          If you have questions, please contact your surgeon's office.       Oral Hygiene is also important to reduce your risk of infection.                                    Remember - BRUSH YOUR TEETH THE MORNING OF SURGERY WITH YOUR REGULAR  TOOTHPASTE  DENTURES WILL BE REMOVED PRIOR TO SURGERY PLEASE DO NOT APPLY "Poly grip" OR ADHESIVES!!!   Do NOT smoke after Midnight   Stop all vitamins and herbal supplements 7 days before surgery.   Take these medicines the morning of surgery with A SIP OF WATER:  none   DO NOT TAKE ANY ORAL DIABETIC MEDICATIONS DAY OF YOUR SURGERY  Bring CPAP mask and tubing day of surgery.                              You may not have any metal on your body including hair pins, jewelry, and body piercing             Do not wear make-up, lotions, powders, perfumes/cologne, or deodorant  Do not wear nail polish including gel and S&S, artificial/acrylic nails, or any other type of covering on natural nails including finger and toenails. If you have artificial nails, gel coating, etc. that needs to be removed by a nail salon please have this removed prior to surgery or surgery may need to be canceled/ delayed if the surgeon/ anesthesia feels like they are unable to be safely monitored.   Do not shave  48 hours prior to surgery.               Men may shave face and neck.   Do not bring valuables to the hospital. [facility] IS NOT             RESPONSIBLE   FOR VALUABLES.   Contacts, glasses, dentures or bridgework may not be worn into surgery.   Bring small overnight bag day of surgery.   DO NOT BRING YOUR HOME MEDICATIONS TO THE HOSPITAL. PHARMACY WILL DISPENSE MEDICATIONS LISTED ON YOUR MEDICATION LIST TO YOU DURING YOUR ADMISSION IN THE HOSPITAL!    Patients discharged on the day of surgery will not be allowed to drive home.  Someone NEEDS to stay with you for the first 24 hours after  anesthesia.   Special Instructions: Bring a copy of your healthcare power of attorney and living will documents the day of surgery if you haven't scanned them before.              Please read over the following fact sheets you were given: IF YOU HAVE QUESTIONS ABOUT YOUR PRE-OP INSTRUCTIONS PLEASE CALL [phone]   If you received a COVID test during your pre-op visit  it is requested that you wear a mask when out in public, stay away from anyone that may not be feeling well and notify your surgeon if you develop symptoms. If you test positive for Covid or have been in contact with anyone that has tested positive in the last 10 days please notify you surgeon.      Pre-operative 5 CHG Bath Instructions   You can play a key role in reducing the risk of infection after surgery. Your skin needs to be as free of germs as possible. You can reduce the number of germs on your skin by washing with CHG (chlorhexidine gluconate) soap before surgery. CHG is an antiseptic soap that kills germs and continues to kill germs even after washing.   DO NOT use if you have an allergy to chlorhexidine/CHG or antibacterial soaps. If your skin becomes reddened or irritated, stop using the CHG and notify one of our RNs at [phone].   Please shower with the CHG soap starting 4 days  before surgery using the following schedule:     Please keep in mind the following:  DO NOT shave, including legs and underarms, starting the day of your first shower.   You may shave your face at any point before/day of surgery.  Place clean sheets on your bed the day you start using CHG soap. Use a clean washcloth (not used since being washed) for each shower. DO NOT sleep with pets once you start using the CHG.   CHG Shower Instructions:  If you choose to wash your hair and private area, wash first with your normal shampoo/soap.  After you use shampoo/soap, rinse your hair and body thoroughly to remove shampoo/soap residue.   Turn the water OFF and apply about 3 tablespoons (45 ml) of CHG soap to a CLEAN washcloth.  Apply CHG soap ONLY FROM YOUR NECK DOWN TO YOUR TOES (washing for 3-5 minutes)  DO NOT use CHG soap on face, private areas, open wounds, or sores.  Pay special attention to the area where your surgery is being performed.  If you are having back surgery, having someone wash your back for you may be helpful. Wait 2 minutes after CHG soap is applied, then you may rinse off the CHG soap.  Pat dry with a clean towel  Put on clean clothes/pajamas   If you choose to wear lotion, please use ONLY the CHG-compatible lotions on the back of this paper.     Additional instructions for the day of surgery: DO NOT APPLY any lotions, deodorants, cologne, or perfumes.   Put on clean/comfortable clothes.  Brush your teeth.  Ask your nurse before applying any prescription medications to the skin.      CHG Compatible Lotions   Aveeno Moisturizing lotion  Cetaphil Moisturizing Cream  Cetaphil Moisturizing Lotion  Clairol Herbal Essence Moisturizing Lotion, Dry Skin  Clairol Herbal Essence Moisturizing Lotion, Extra Dry Skin  Clairol Herbal Essence Moisturizing Lotion, Normal Skin  Curel Age Defying Therapeutic Moisturizing Lotion with Alpha Hydroxy  Curel Extreme Care Body Lotion  Curel Soothing Hands Moisturizing Hand Lotion  Curel Therapeutic Moisturizing Cream, Fragrance-Free  Curel Therapeutic Moisturizing Lotion, Fragrance-Free  Curel Therapeutic Moisturizing Lotion, Original Formula  Eucerin Daily Replenishing Lotion  Eucerin Dry Skin Therapy Plus Alpha Hydroxy Crme  Eucerin Dry Skin Therapy Plus Alpha Hydroxy Lotion  Eucerin Original Crme  Eucerin Original Lotion  Eucerin Plus Crme Eucerin Plus Lotion  Eucerin TriLipid Replenishing Lotion  Keri Anti-Bacterial Hand Lotion  Keri Deep Conditioning Original Lotion Dry Skin Formula Softly Scented  Keri Deep Conditioning Original Lotion, Fragrance  Free Sensitive Skin Formula  Keri Lotion Fast Absorbing Fragrance Free Sensitive Skin Formula  Keri Lotion Fast Absorbing Softly Scented Dry Skin Formula  Keri Original Lotion  Keri Skin Renewal Lotion Keri Silky Smooth Lotion  Keri Silky Smooth Sensitive Skin Lotion  Nivea Body Creamy Conditioning Oil  Nivea Body Extra Enriched Teacher, adult education Moisturizing Lotion Nivea Crme  Nivea Skin Firming Lotion  NutraDerm 30 Skin Lotion  NutraDerm Skin Lotion  NutraDerm Therapeutic Skin Cream  NutraDerm Therapeutic Skin Lotion  ProShield Protective Hand Cream  Provon moisturizing lotion

## 2023-07-02 ENCOUNTER — Encounter (HOSPITAL_COMMUNITY)
Admission: RE | Admit: 2023-07-02 | Discharge: 2023-07-02 | Disposition: A | Discharge status: Home / Self Care | Payer: Medicare Other | Source: Ambulatory Visit | Attending: Orthopedic Surgery | Admitting: Orthopedic Surgery

## 2023-07-02 ENCOUNTER — Other Ambulatory Visit: Payer: Self-pay

## 2023-07-02 ENCOUNTER — Encounter (HOSPITAL_COMMUNITY): Payer: Self-pay

## 2023-07-02 VITALS — BP 109/84 | HR 68 | Temp 97.9°F | Resp 16 | Ht 71.0 in | Wt 176.0 lb

## 2023-07-02 DIAGNOSIS — K219 Gastro-esophageal reflux disease without esophagitis: Secondary | ICD-10-CM | POA: Insufficient documentation

## 2023-07-02 DIAGNOSIS — I2510 Atherosclerotic heart disease of native coronary artery without angina pectoris: Secondary | ICD-10-CM | POA: Insufficient documentation

## 2023-07-02 DIAGNOSIS — Z01818 Encounter for other preprocedural examination: Secondary | ICD-10-CM

## 2023-07-02 DIAGNOSIS — N183 Chronic kidney disease, stage 3 unspecified: Secondary | ICD-10-CM | POA: Diagnosis not present

## 2023-07-02 DIAGNOSIS — Z01812 Encounter for preprocedural laboratory examination: Secondary | ICD-10-CM | POA: Insufficient documentation

## 2023-07-02 LAB — BASIC METABOLIC PANEL
Anion gap: 8 (ref 5–15)
BUN: 27 mg/dL — ABNORMAL HIGH (ref 8–23)
CO2: 24 mmol/L (ref 22–32)
Calcium: 9.1 mg/dL (ref 8.9–10.3)
Chloride: 104 mmol/L (ref 98–111)
Creatinine, Ser: 1.03 mg/dL (ref 0.61–1.24)
GFR, Estimated: >60 mL/min (ref >60)
Glucose, Bld: 87 mg/dL (ref 70–99)
Potassium: 4.1 mmol/L (ref 3.5–5.1)
Sodium: 136 mmol/L (ref 135–145)

## 2023-07-02 LAB — CBC
HCT: 37.9 % — ABNORMAL LOW (ref 39.0–52.0)
Hemoglobin: 12.8 g/dL — ABNORMAL LOW (ref 13.0–17.0)
MCH: 31.5 pg (ref 26.0–34.0)
MCHC: 33.8 g/dL (ref 30.0–36.0)
MCV: 93.3 fL (ref 80.0–100.0)
Platelets: 145 10*3/uL — ABNORMAL LOW (ref 150–400)
RBC: 4.06 MIL/uL — ABNORMAL LOW (ref 4.22–5.81)
RDW: 13.2 % (ref 11.5–15.5)
WBC: 6.7 10*3/uL (ref 4.0–10.5)
nRBC: 0 % (ref 0.0–0.2)

## 2023-07-02 LAB — SURGICAL PCR SCREEN
MRSA, PCR: NEGATIVE
Staphylococcus aureus: NEGATIVE

## 2023-07-03 ENCOUNTER — Encounter (HOSPITAL_COMMUNITY): Payer: Self-pay

## 2023-07-03 NOTE — Care Plan (Signed)
Ortho Bundle Case Management Note  Patient Details  Name: Kurt Duke MRN: [id number] Date of Birth: [birth year]   met with patient and wife in the office for H&P. will discharge to home with family to assist. has equipment at home. OPPT set up with [facility]- [facility]. discharge instructions discussed and questions answered. Patient and MD in agreement with plan. Choice offered.                     DME Arranged:    DME Agency:     HH Arranged:    HH Agency:     Additional Comments: Please contact me with any questions of if this plan should need to change.  Shauna Hugh,  RN,BSN,MHA,CCM  [facility] Orthopaedic Specialist  [phone] 07/03/2023, 11:30 AM

## 2023-07-03 NOTE — Progress Notes (Signed)
DISCUSSION: Kurt Duke is a 77 yo male who presents to PAT prior to right THA. PMH of mild, nonobstructive CAD by cath, GERD, CKD.  Patient was referred to Cardiology in 05/2022 due to chest pain. He underwent a stress test which was suggestive of moderate sized inferior infarct scar with normal LVEF at 66%. He underwent cardiac cath on 06/27/2022 which showed no evidence of obstructive CAD but microvascular dysfunction. He couldn't tolerate a BB and could only tolerate low dose statin. Cardiac clearance done on 05/30/23:  "Chart reviewed as part of pre-operative protocol coverage. Patient was contacted 05/30/2023 in reference to pre-operative risk assessment for pending surgery as outlined below.  Kurt Duke was last seen on 04/02/2023 by Dr. Diona Browner.  Since that day, Kurt Duke has done well from a cardiac standpoint. He denies any new symptoms or concerns. He is able to complete > 4 METS without difficulty.    Therefore, based on ACC/AHA guidelines, the patient would be at acceptable risk for the planned procedure without further cardiovascular testing.    The patient was advised that if he develops new symptoms prior to surgery to contact our office to arrange for a follow-up visit, and he verbalized understanding.   Per office protocol, he may hold Aspirin for 5-7 days prior to procedure. Please resume Aspirin as soon as possible postprocedure, at the discretion of the surgeon"  VS: BP 109/84   Pulse 68   Temp 36.6 C (Oral)   Resp 16   Ht 5\' 11"  (1.803 m)   Wt 79.8 kg   SpO2 100%   BMI 24.55 kg/m   PROVIDERS: Ignatius Specking, MD Cardiology: Nona Dell, MD  LABS: Labs reviewed: Acceptable for surgery. (all labs ordered are listed, but only abnormal results are displayed)  Labs Reviewed  CBC - Abnormal; Notable for the following components:      Result Value   RBC 4.06 (*)    Hemoglobin 12.8 (*)    HCT 37.9 (*)    Platelets 145 (*)    All other components within normal  limits  BASIC METABOLIC PANEL - Abnormal; Notable for the following components:   BUN 27 (*)    All other components within normal limits  SURGICAL PCR SCREEN     IMAGES:   EKG:   CV:  Left heart cath 06/28/2022:  CONCLUSIONS: Microvascular dysfunction with CFR 2.3 and IMR (index of myocardial resistance) 57 (normal less than 25). Right dominant coronary anatomy.  Normal right coronary. Left coronary system is widely patent with luminal irregularities noted in the mid LAD. Normal LV systolic function.  EF greater than 55%.  LVEDP is normal.   RECOMMENDATIONS:   Aggressive risk factor modification. Start ARB therapy and consider adding either beta-blocker or negative inotropic calcium channel blocker. Defect on nuclear imaging likely related to soft tissue attenuation.  Echo 04/29/2022:  Left ventricular cavity is normal in size Diastolic filling with impaired relaxation pattern Normal global wall motion Calculated EF 56.84% Left atrium size is normal Right ventricle is normal in size Trace aortic regurgitation Trace tricuspid regurgitation No pericardial effusion observed IVC is normal with respiration variation of >50% collapse No significant valve stenosis or insufficiencies No evidence of pulmonary HTN    Past Medical History:  Diagnosis Date   Cancer (HCC)    Skin   CKD (chronic kidney disease) stage 3, GFR 30-59 ml/min (HCC)    COVID-19    Degenerative disc disease, cervical    GERD (gastroesophageal  reflux disease)    History of varicose veins    Insomnia    Low back pain    Mixed hyperlipidemia    Spinal stenosis     Past Surgical History:  Procedure Laterality Date   APPENDECTOMY     CARDIAC CATHETERIZATION     2010   CHOLECYSTECTOMY     COLONOSCOPY     2009   COLONOSCOPY N/A 03/10/2019   Procedure: COLONOSCOPY;  Surgeon: Malissa Hippo, MD;  [facility];  Service: Endoscopy;  Laterality: N/A;  1030   CORONARY PRESSURE/FFR  STUDY N/A 06/27/2022   Procedure: INTRAVASCULAR PRESSURE WIRE/FFR STUDY;  Surgeon: Lyn Records, MD;  Location: [facility] INVASIVE CV LAB;  Service: Cardiovascular;  Laterality: N/A;   INGUINAL HERNIA REPAIR     Bilateral   Knee surgeries     x 4   LEFT HEART CATH AND CORONARY ANGIOGRAPHY N/A 06/27/2022   Procedure: LEFT HEART CATH AND CORONARY ANGIOGRAPHY;  Surgeon: Lyn Records, MD;  Location: [facility] INVASIVE CV LAB;  Service: Cardiovascular;  Laterality: N/A;   mortons neuroma on right foot surgery      POLYPECTOMY  03/10/2019   Procedure: POLYPECTOMY;  Surgeon: Malissa Hippo, MD;  Location: [facility];  Service: Endoscopy;;   VEIN LIGATION AND STRIPPING      MEDICATIONS:  acetaminophen (TYLENOL) 500 MG tablet   aspirin EC 81 MG tablet   ibuprofen (ADVIL) 200 MG tablet   loratadine (CLARITIN) 10 MG tablet   Menthol-Methyl Salicylate (MUSCLE RUB EX)   rosuvastatin (CRESTOR) 5 MG tablet   traMADol (ULTRAM) 50 MG tablet   zolpidem (AMBIEN) 10 MG tablet   No current facility-administered medications for this encounter.   Marcille Blanco [facility] Surgical Short Stay/Anesthesiology [facility] Phone [phone] 07/03/2023 1:06 PM

## 2023-07-03 NOTE — Anesthesia Preprocedure Evaluation (Addendum)
Anesthesia Evaluation  Patient identified by MRN, date of birth, ID band Patient awake    Reviewed: Allergy & Precautions, Patient's Chart, lab work & pertinent test results  Airway Mallampati: II  TM Distance: >3 FB Neck ROM: Full    Dental  (+) Teeth Intact, Dental Advisory Given   Pulmonary neg pulmonary ROS   breath sounds clear to auscultation       Cardiovascular negative cardio ROS  Rhythm:Regular Rate:Normal     Neuro/Psych negative neurological ROS  negative psych ROS   GI/Hepatic Neg liver ROS,GERD  ,,  Endo/Other    Renal/GU CRFRenal disease     Musculoskeletal  (+) Arthritis ,    Abdominal   Peds  Hematology   Anesthesia Other Findings   Reproductive/Obstetrics                             Anesthesia Physical Anesthesia Plan  ASA: 2  Anesthesia Plan: Spinal   Post-op Pain Management: Tylenol PO (pre-op)*   Induction: Intravenous  PONV Risk Score and Plan: 2 and Ondansetron and Propofol infusion  Airway Management Planned: Natural Airway and Nasal Cannula  Additional Equipment: None  Intra-op Plan:   Post-operative Plan:   Informed Consent: I have reviewed the patients History and Physical, chart, labs and discussed the procedure including the risks, benefits and alternatives for the proposed anesthesia with the patient or authorized representative who has indicated his/her understanding and acceptance.     Dental advisory given  Plan Discussed with: CRNA  Anesthesia Plan Comments: (See PAT note from 10/2 by Sherlie Ban PA-C  Lab Results      Component                Value               Date                      WBC                      6.7                 07/02/2023                HGB                      12.8 (L)            07/02/2023                HCT                      37.9 (L)            07/02/2023                MCV                      93.3                 07/02/2023                PLT                      145 (L)             07/02/2023           )  Anesthesia Quick Evaluation

## 2023-07-07 NOTE — H&P (Signed)
HIP ARTHROPLASTY ADMISSION H&P  Patient ID: Kurt Duke MRN: [id number] DOB/AGE: 78-Feb-1947 78 y.o.  Chief Complaint: right hip pain.  Planned Procedure Date: 07/15/23 Medical Clearance by Dr. Sherril Croon   Cardiac Clearance by Dr. Diona Browner Pain management clearance by Dr. Sherril Croon   HPI: Kurt Duke is a 77 y.o. male who presents for evaluation of OA RIGHT HIP. The patient has a history of pain and functional disability in the right hip due to arthritis and has failed non-surgical conservative treatments for greater than 12 weeks to include NSAID's and/or analgesics, corticosteriod injections, and activity modification.  Onset of symptoms was gradual, starting 3 years ago with gradually worsening course since that time. The patient noted no past surgery on the right hip.  Patient currently rates pain at 10 out of 10 with activity. Patient has night pain, worsening of pain with activity and weight bearing, and pain that interferes with activities of daily living.  Patient has evidence of subchondral sclerosis, periarticular osteophytes, and joint space narrowing by imaging studies.  There is no active infection.  Past Medical History:  Diagnosis Date   Cancer (HCC)    Skin   CKD (chronic kidney disease) stage 3, GFR 30-59 ml/min (HCC)    COVID-19    Degenerative disc disease, cervical    GERD (gastroesophageal reflux disease)    History of varicose veins    Insomnia    Low back pain    Mixed hyperlipidemia    Spinal stenosis    Past Surgical History:  Procedure Laterality Date   APPENDECTOMY     CARDIAC CATHETERIZATION     2010   CHOLECYSTECTOMY     COLONOSCOPY     2009   COLONOSCOPY N/A 03/10/2019   Procedure: COLONOSCOPY;  Surgeon: Malissa Hippo, MD;  [facility];  Service: Endoscopy;  Laterality: N/A;  1030   CORONARY PRESSURE/FFR STUDY N/A 06/27/2022   Procedure: INTRAVASCULAR PRESSURE WIRE/FFR STUDY;  Surgeon: Lyn Records, MD;  Location[facility] CV LAB;   Service: Cardiovascular;  Laterality: N/A;   INGUINAL HERNIA REPAIR     Bilateral   Knee surgeries     x 4   LEFT HEART CATH AND CORONARY ANGIOGRAPHY N/A 06/27/2022   Procedure: LEFT HEART CATH AND CORONARY ANGIOGRAPHY;  Surgeon: Lyn Records, MD;  [facility] LAB;  Service: Cardiovascular;  Laterality: N/A;   mortons neuroma on right foot surgery      POLYPECTOMY  03/10/2019   Procedure: POLYPECTOMY;  Surgeon: Malissa Hippo, MD;  Location[facility];  Service: Endoscopy;;   VEIN LIGATION AND STRIPPING     Allergies  Allergen Reactions   Vicodin [Hydrocodone-Acetaminophen]     Unknown reaction    Doxycycline Other (See Comments)    Skin sensitivity   Prior to Admission medications   Medication Sig Start Date End Date Taking? Authorizing Provider  acetaminophen (TYLENOL) 500 MG tablet Take 500 mg by mouth every 6 (six) hours as needed for moderate pain or headache.   Yes [provider]  aspirin EC 81 MG tablet Take 81 mg by mouth daily. Swallow whole.   Yes [provider]  ibuprofen (ADVIL) 200 MG tablet Take 200 mg by mouth every 6 (six) hours as needed for moderate pain.   Yes [provider]  loratadine (CLARITIN) 10 MG tablet Take 10 mg by mouth daily as needed for allergies.   Yes [provider]  Menthol-Methyl Salicylate (MUSCLE RUB EX)  Apply 1 application topically daily as needed (pain).   Yes [provider]  rosuvastatin (CRESTOR) 5 MG tablet Take 1 tablet (5 mg total) by mouth daily. Patient taking differently: Take 5 mg by mouth 2 (two) times a week. 06/21/22  Yes Jonelle Sidle, MD  traMADol (ULTRAM) 50 MG tablet Take 50 mg by mouth 3 (three) times daily as needed for moderate pain.    Yes [provider]  zolpidem (AMBIEN) 10 MG tablet Take 10 mg by mouth at bedtime.   Yes [provider]   Social History   Socioeconomic History   Marital status: Married    Spouse name: Not on  file   Number of children: Not on file   Years of education: Not on file   Highest education level: Not on file  Occupational History   Not on file  Tobacco Use   Smoking status: Never   Smokeless tobacco: Never  Vaping Use   Vaping status: Never Used  Substance and Sexual Activity   Alcohol use: Yes    Alcohol/week: 1.0 standard drink of alcohol    Types: 1 Cans of beer per week    Comment: on occas   Drug use: Never   Sexual activity: Not on file  Other Topics Concern   Not on file  Social History Narrative   Not on file   Social Determinants of Health   Financial Resource Strain: Not on file  Food Insecurity: Not on file  Transportation Needs: Not on file  Physical Activity: Not on file  Stress: Not on file  Social Connections: Unknown (02/10/2022)   Received from [facility]   Social Network    Social Network: Not on file   Family History  Problem Relation Age of Onset   Alzheimer's disease Mother     ROS: Currently denies lightheadedness, dizziness, Fever, chills, CP, SOB.   No personal history of DVT, PE, MI, or CVA. No loose teeth or dentures All other systems have been reviewed and were otherwise currently negative with the exception of those mentioned in the HPI and as above.  Objective: Vitals: Ht: 5'10" Wt: 180.3 lbs Temp: 97.9 BP: 141/88 Pulse: 83 O2 98% on room air.   Physical Exam: General: Alert, NAD. Trendelenberg Gait  HEENT: EOMI, Good Neck Extension  Pulm: No increased work of breathing.  Clear B/L A/P w/o crackle or wheeze.  CV: RRR, No m/g/r appreciated  GI: soft, NT, ND. BS x 4 quadrants Neuro: CN II-XII grossly intact without focal deficit.  Sensation intact distally Skin: No lesions in the area of chief complaint MSK/Surgical Site: + TTP. Hip ROM decreased d/t pain. + Stinchfield. + SLR. + FABER/FADIR. Decreased strength.  NVI.    Imaging Review Plain radiographs demonstrate severe degenerative joint disease of the  right hip.   The bone quality appears to be fair for age and reported activity level.  Preoperative templating of the joint replacement has been completed, documented, and submitted to the Operating Room personnel in order to optimize intra-operative equipment management.  Assessment: OA RIGHT HIP Active Problems:   * No active hospital problems. *   Plan: Plan for Procedure(s): TOTAL HIP ARTHROPLASTY  The patient history, physical exam, clinical judgement of the provider and imaging are consistent with end stage degenerative joint disease and total joint arthroplasty is deemed medically necessary. The treatment options including medical management, injection therapy, and arthroplasty were discussed at length. The risks and benefits of Procedure(s): TOTAL  HIP ARTHROPLASTY were presented and reviewed.  The risks of nonoperative treatment, versus surgical intervention including but not limited to continued pain, aseptic loosening, stiffness, dislocation/subluxation, infection, bleeding, nerve injury, blood clots, cardiopulmonary complications, morbidity, mortality, among others were discussed. The patient verbalizes understanding and wishes to proceed with the plan.  Patient is being admitted for surgery, pain control, PT, prophylactic antibiotics, VTE prophylaxis, progressive ambulation, ADL's and discharge planning.   Dental prophylaxis discussed and recommended for 2 years postoperatively.  The patient does meet the criteria for TXA which will be used perioperatively.   ASA 81 mg BID will be used postoperatively for DVT prophylaxis in addition to SCDs, and early ambulation. Plan for patient to continue his chronic daily Tramadol. We will give Oxycodone, Mobic, Tylenol for pain.   Robaxin for muscle spasm.  Zofran for nausea and vomiting. Pharmacy- [facility] The patient is planning to be discharged home with OPPT and into the care of his wife Bonita Quin who can be reached at  [phone] Follow up appt 07/30/23 at 4:15pm     Marzetta Board Office [phone] 07/07/2023 4:08 PM

## 2023-07-09 DIAGNOSIS — D485 Neoplasm of uncertain behavior of skin: Secondary | ICD-10-CM | POA: Diagnosis not present

## 2023-07-15 ENCOUNTER — Encounter (HOSPITAL_COMMUNITY)
Admission: RE | Disposition: A | Discharge status: Home / Self Care | Payer: Self-pay | Source: Ambulatory Visit | Attending: Orthopedic Surgery

## 2023-07-15 ENCOUNTER — Ambulatory Visit (HOSPITAL_COMMUNITY): Payer: Medicare Other | Admitting: Anesthesiology

## 2023-07-15 ENCOUNTER — Ambulatory Visit (HOSPITAL_COMMUNITY)
Admission: RE | Admit: 2023-07-15 | Discharge: 2023-07-15 | Disposition: A | Discharge status: Home / Self Care | Payer: Medicare Other | Source: Ambulatory Visit | Attending: Orthopedic Surgery | Admitting: Orthopedic Surgery

## 2023-07-15 ENCOUNTER — Ambulatory Visit (HOSPITAL_COMMUNITY): Payer: Medicare Other | Admitting: Medical

## 2023-07-15 ENCOUNTER — Encounter (HOSPITAL_COMMUNITY): Payer: Self-pay | Admitting: Orthopedic Surgery

## 2023-07-15 ENCOUNTER — Ambulatory Visit (HOSPITAL_COMMUNITY): Payer: Medicare Other

## 2023-07-15 DIAGNOSIS — Z96641 Presence of right artificial hip joint: Secondary | ICD-10-CM | POA: Diagnosis not present

## 2023-07-15 DIAGNOSIS — M1611 Unilateral primary osteoarthritis, right hip: Secondary | ICD-10-CM | POA: Insufficient documentation

## 2023-07-15 DIAGNOSIS — Z01818 Encounter for other preprocedural examination: Secondary | ICD-10-CM

## 2023-07-15 DIAGNOSIS — N183 Chronic kidney disease, stage 3 unspecified: Secondary | ICD-10-CM | POA: Diagnosis not present

## 2023-07-15 DIAGNOSIS — K219 Gastro-esophageal reflux disease without esophagitis: Secondary | ICD-10-CM | POA: Diagnosis not present

## 2023-07-15 HISTORY — PX: TOTAL HIP ARTHROPLASTY: SHX124

## 2023-07-15 LAB — TYPE AND SCREEN
ABO/RH(D): A POS
Antibody Screen: NEGATIVE

## 2023-07-15 LAB — ABO/RH: ABO/RH(D): A POS

## 2023-07-15 SURGERY — ARTHROPLASTY, HIP, TOTAL, ANTERIOR APPROACH
Anesthesia: Spinal | Site: Hip | Laterality: Right

## 2023-07-15 MED ORDER — LACTATED RINGERS IV SOLN: INTRAVENOUS | Status: DC

## 2023-07-15 MED ORDER — FENTANYL CITRATE (PF) 100 MCG/2ML IJ SOLN
INTRAMUSCULAR | Status: DC | PRN
Start: 2023-07-15 — End: 2023-07-15
  Administered 2023-07-15 (×2): 25 ug via INTRAVENOUS

## 2023-07-15 MED ORDER — DEXAMETHASONE SODIUM PHOSPHATE 10 MG/ML IJ SOLN
8.0000 mg | Freq: Once | INTRAMUSCULAR | Status: AC
  Administered 2023-07-15: 8 mg via INTRAVENOUS

## 2023-07-15 MED ORDER — 0.9 % SODIUM CHLORIDE (POUR BTL) OPTIME
TOPICAL | Status: DC | PRN
  Administered 2023-07-15: 1000 mL

## 2023-07-15 MED ORDER — PHENYLEPHRINE HCL (PRESSORS) 10 MG/ML IV SOLN
INTRAVENOUS | Status: DC | PRN
Start: 2023-07-15 — End: 2023-07-15
  Administered 2023-07-15: 80 ug via INTRAVENOUS

## 2023-07-15 MED ORDER — ORAL CARE MOUTH RINSE: 15.0000 mL | Freq: Once | OROMUCOSAL | Status: AC

## 2023-07-15 MED ORDER — OXYCODONE HCL 5 MG PO TABS
ORAL_TABLET | ORAL | Status: AC
  Administered 2023-07-15: 5 mg
  Filled 2023-07-15: qty 1

## 2023-07-15 MED ORDER — POVIDONE-IODINE 10 % EX SWAB: 2.0000 | Freq: Once | CUTANEOUS | Status: DC

## 2023-07-15 MED ORDER — POVIDONE-IODINE 10 % EX SWAB
2.0000 | Freq: Once | CUTANEOUS | Status: AC
  Administered 2023-07-15: 2 via TOPICAL

## 2023-07-15 MED ORDER — METHOCARBAMOL 750 MG PO TABS: 750.0000 mg | ORAL_TABLET | Freq: Three times a day (TID) | ORAL | 0 refills | Status: DC | PRN

## 2023-07-15 MED ORDER — PROPOFOL 500 MG/50ML IV EMUL
INTRAVENOUS | Status: DC | PRN
  Administered 2023-07-15: 25 ug/kg/min via INTRAVENOUS

## 2023-07-15 MED ORDER — PROPOFOL 10 MG/ML IV BOLUS
INTRAVENOUS | Status: AC
  Filled 2023-07-15: qty 20

## 2023-07-15 MED ORDER — PHENYLEPHRINE 80 MCG/ML (10ML) SYRINGE FOR IV PUSH (FOR BLOOD PRESSURE SUPPORT)
PREFILLED_SYRINGE | INTRAVENOUS | Status: AC
  Filled 2023-07-15: qty 10

## 2023-07-15 MED ORDER — BUPIVACAINE IN DEXTROSE 0.75-8.25 % IT SOLN
INTRATHECAL | Status: DC | PRN
Start: 2023-07-15 — End: 2023-07-15
  Administered 2023-07-15: 2 mL via INTRATHECAL

## 2023-07-15 MED ORDER — ONDANSETRON 4 MG PO TBDP: 4.0000 mg | ORAL_TABLET | Freq: Three times a day (TID) | ORAL | 0 refills | Status: DC | PRN

## 2023-07-15 MED ORDER — CEFAZOLIN SODIUM-DEXTROSE 2-4 GM/100ML-% IV SOLN
INTRAVENOUS | Status: AC
  Administered 2023-07-15: 2 g via INTRAVENOUS
  Filled 2023-07-15: qty 100

## 2023-07-15 MED ORDER — ONDANSETRON HCL 4 MG/2ML IJ SOLN
INTRAMUSCULAR | Status: DC | PRN
  Administered 2023-07-15: 4 mg via INTRAVENOUS

## 2023-07-15 MED ORDER — MEPERIDINE HCL 50 MG/ML IJ SOLN: 6.2500 mg | INTRAMUSCULAR | Status: DC | PRN

## 2023-07-15 MED ORDER — CEFAZOLIN SODIUM-DEXTROSE 2-4 GM/100ML-% IV SOLN
2.0000 g | INTRAVENOUS | Status: AC
  Administered 2023-07-15: 2 g via INTRAVENOUS
  Filled 2023-07-15: qty 100

## 2023-07-15 MED ORDER — ASPIRIN 81 MG PO TBEC: 81.0000 mg | DELAYED_RELEASE_TABLET | Freq: Two times a day (BID) | ORAL | 0 refills | Status: AC

## 2023-07-15 MED ORDER — PHENYLEPHRINE HCL-NACL 20-0.9 MG/250ML-% IV SOLN
INTRAVENOUS | Status: DC | PRN
Start: 2023-07-15 — End: 2023-07-15
  Administered 2023-07-15: 20 ug/min via INTRAVENOUS

## 2023-07-15 MED ORDER — MELOXICAM 15 MG PO TABS: 15.0000 mg | ORAL_TABLET | Freq: Every day | ORAL | 0 refills | Status: AC | PRN

## 2023-07-15 MED ORDER — ACETAMINOPHEN 160 MG/5ML PO SOLN: 325.0000 mg | Freq: Once | ORAL | Status: DC | PRN

## 2023-07-15 MED ORDER — ACETAMINOPHEN 10 MG/ML IV SOLN: 1000.0000 mg | Freq: Once | INTRAVENOUS | Status: DC | PRN

## 2023-07-15 MED ORDER — ACETAMINOPHEN 325 MG PO TABS: 325.0000 mg | ORAL_TABLET | Freq: Once | ORAL | Status: DC | PRN

## 2023-07-15 MED ORDER — CHLORHEXIDINE GLUCONATE 0.12 % MT SOLN
15.0000 mL | Freq: Once | OROMUCOSAL | Status: AC
  Administered 2023-07-15: 15 mL via OROMUCOSAL

## 2023-07-15 MED ORDER — BUPIVACAINE LIPOSOME 1.3 % IJ SUSP: 10.0000 mL | Freq: Once | INTRAMUSCULAR | Status: DC

## 2023-07-15 MED ORDER — METHOCARBAMOL 500 MG IVPB - SIMPLE MED
500.0000 mg | Freq: Four times a day (QID) | INTRAVENOUS | Status: DC | PRN
  Administered 2023-07-15: 500 mg via INTRAVENOUS

## 2023-07-15 MED ORDER — DEXAMETHASONE SODIUM PHOSPHATE 10 MG/ML IJ SOLN
INTRAMUSCULAR | Status: AC
  Filled 2023-07-15: qty 1

## 2023-07-15 MED ORDER — ACETAMINOPHEN 500 MG PO TABS: 1000.0000 mg | ORAL_TABLET | Freq: Four times a day (QID) | ORAL | 0 refills | Status: AC | PRN

## 2023-07-15 MED ORDER — CEFAZOLIN SODIUM-DEXTROSE 2-4 GM/100ML-% IV SOLN: 2.0000 g | Freq: Four times a day (QID) | INTRAVENOUS | Status: DC

## 2023-07-15 MED ORDER — ACETAMINOPHEN 500 MG PO TABS: 1000.0000 mg | ORAL_TABLET | Freq: Four times a day (QID) | ORAL | Status: DC

## 2023-07-15 MED ORDER — DROPERIDOL 2.5 MG/ML IJ SOLN: 0.6250 mg | Freq: Once | INTRAMUSCULAR | Status: DC | PRN

## 2023-07-15 MED ORDER — SODIUM CHLORIDE 0.9 % IV SOLN
INTRAVENOUS | Status: DC | PRN
  Administered 2023-07-15: 20 mL

## 2023-07-15 MED ORDER — FENTANYL CITRATE (PF) 100 MCG/2ML IJ SOLN
INTRAMUSCULAR | Status: AC
  Filled 2023-07-15: qty 2

## 2023-07-15 MED ORDER — OXYCODONE HCL 5 MG PO TABS
5.0000 mg | ORAL_TABLET | ORAL | 0 refills | Status: DC | PRN
Start: 2023-07-15 — End: 2024-06-15

## 2023-07-15 MED ORDER — TRANEXAMIC ACID-NACL 1000-0.7 MG/100ML-% IV SOLN
INTRAVENOUS | Status: AC
  Filled 2023-07-15: qty 100

## 2023-07-15 MED ORDER — SODIUM CHLORIDE (PF) 0.9 % IJ SOLN
INTRAMUSCULAR | Status: AC
  Filled 2023-07-15: qty 10

## 2023-07-15 MED ORDER — ONDANSETRON HCL 4 MG/2ML IJ SOLN
INTRAMUSCULAR | Status: AC
  Filled 2023-07-15: qty 2

## 2023-07-15 MED ORDER — METHOCARBAMOL 500 MG IVPB - SIMPLE MED
INTRAVENOUS | Status: AC
  Filled 2023-07-15: qty 55

## 2023-07-15 MED ORDER — ACETAMINOPHEN 500 MG PO TABS
1000.0000 mg | ORAL_TABLET | Freq: Once | ORAL | Status: AC
  Administered 2023-07-15: 1000 mg via ORAL
  Filled 2023-07-15: qty 2

## 2023-07-15 MED ORDER — BUPIVACAINE LIPOSOME 1.3 % IJ SUSP
INTRAMUSCULAR | Status: AC
  Filled 2023-07-15: qty 10

## 2023-07-15 MED ORDER — TRANEXAMIC ACID-NACL 1000-0.7 MG/100ML-% IV SOLN
1000.0000 mg | INTRAVENOUS | Status: AC
  Administered 2023-07-15: 1000 mg via INTRAVENOUS
  Filled 2023-07-15: qty 100

## 2023-07-15 MED ORDER — TRANEXAMIC ACID-NACL 1000-0.7 MG/100ML-% IV SOLN
1000.0000 mg | Freq: Once | INTRAVENOUS | Status: AC
  Administered 2023-07-15: 1000 mg via INTRAVENOUS

## 2023-07-15 MED ORDER — HYDROMORPHONE HCL 1 MG/ML IJ SOLN: 0.2500 mg | INTRAMUSCULAR | Status: DC | PRN

## 2023-07-15 MED ORDER — PROPOFOL 1000 MG/100ML IV EMUL
INTRAVENOUS | Status: AC
  Filled 2023-07-15: qty 100

## 2023-07-15 MED ORDER — WATER FOR IRRIGATION, STERILE IR SOLN
Status: DC | PRN
  Administered 2023-07-15: 1000 mL

## 2023-07-15 MED ORDER — METHOCARBAMOL 500 MG PO TABS: 500.0000 mg | ORAL_TABLET | Freq: Four times a day (QID) | ORAL | Status: DC | PRN

## 2023-07-15 SURGICAL SUPPLY — 45 items
APL PRP STRL LF DISP 70% ISPRP (MISCELLANEOUS) ×1
BAG COUNTER SPONGE SURGICOUNT (BAG) IMPLANT
BAG SPEC THK2 15X12 ZIP CLS (MISCELLANEOUS)
BAG SPNG CNTER NS LX DISP (BAG)
BAG ZIPLOCK 12X15 (MISCELLANEOUS) IMPLANT
BLADE SAG 18X100X1.27 (BLADE) ×1 IMPLANT
CHLORAPREP W/TINT 26 (MISCELLANEOUS) ×1 IMPLANT
CLSR STERI-STRIP ANTIMIC 1/2X4 (GAUZE/BANDAGES/DRESSINGS) ×1 IMPLANT
COVER PERINEAL POST (MISCELLANEOUS) ×1 IMPLANT
COVER SURGICAL LIGHT HANDLE (MISCELLANEOUS) ×1 IMPLANT
DRAPE IMP U-DRAPE 54X76 (DRAPES) ×1 IMPLANT
DRAPE STERI IOBAN 125X83 (DRAPES) ×1 IMPLANT
DRAPE U-SHAPE 47X51 STRL (DRAPES) ×2 IMPLANT
DRSG MEPILEX POST OP 4X8 (GAUZE/BANDAGES/DRESSINGS) ×1 IMPLANT
ELECT REM PT RETURN 15FT ADLT (MISCELLANEOUS) ×1 IMPLANT
GLOVE BIO SURGEON STRL SZ7.5 (GLOVE) ×1 IMPLANT
GLOVE BIOGEL PI IND STRL 7.5 (GLOVE) ×1 IMPLANT
GLOVE BIOGEL PI IND STRL 8 (GLOVE) ×1 IMPLANT
GLOVE SURG SYN 7.5 E (GLOVE) ×1 IMPLANT
GLOVE SURG SYN 7.5 PF PI (GLOVE) ×1 IMPLANT
GOWN STRL REUS W/ TWL LRG LVL3 (GOWN DISPOSABLE) ×1 IMPLANT
GOWN STRL REUS W/ TWL XL LVL3 (GOWN DISPOSABLE) ×1 IMPLANT
GOWN STRL REUS W/TWL LRG LVL3 (GOWN DISPOSABLE) ×1
GOWN STRL REUS W/TWL XL LVL3 (GOWN DISPOSABLE) ×1
HEAD BIOLOX HIP 36/-5 (Joint) IMPLANT
HIP BIOLOX HD 36/-5 (Joint) ×1 IMPLANT
HOLDER FOLEY CATH W/STRAP (MISCELLANEOUS) IMPLANT
INSERT TRIDENT POLY 36 0DEG (Insert) IMPLANT
KIT TURNOVER KIT A (KITS) IMPLANT
MANIFOLD NEPTUNE II (INSTRUMENTS) ×1 IMPLANT
NS IRRIG 1000ML POUR BTL (IV SOLUTION) ×1 IMPLANT
PACK ANTERIOR HIP CUSTOM (KITS) ×1 IMPLANT
PROTECTOR NERVE ULNAR (MISCELLANEOUS) ×1 IMPLANT
SCREW HEX LP 6.5X20 (Screw) IMPLANT
SHELL ACETABUL CLUSTER SZ 54 (Shell) IMPLANT
SPIKE FLUID TRANSFER (MISCELLANEOUS) ×2 IMPLANT
STEM STD OFFSET SZ7 38 (Stem) IMPLANT
SUT MNCRL AB 3-0 PS2 18 (SUTURE) ×1 IMPLANT
SUT VIC AB 0 CT1 36 (SUTURE) ×1 IMPLANT
SUT VIC AB 1 CT1 36 (SUTURE) ×1 IMPLANT
SUT VIC AB 2-0 CT1 27 (SUTURE) ×1
SUT VIC AB 2-0 CT1 TAPERPNT 27 (SUTURE) ×1 IMPLANT
TRAY FOLEY MTR SLVR 16FR STAT (SET/KITS/TRAYS/PACK) IMPLANT
TUBE SUCTION HIGH CAP CLEAR NV (SUCTIONS) ×1 IMPLANT
WATER STERILE IRR 1000ML POUR (IV SOLUTION) ×2 IMPLANT

## 2023-07-15 NOTE — Discharge Instructions (Addendum)

## 2023-07-15 NOTE — Anesthesia Procedure Notes (Signed)
Spinal  Start time: 07/15/2023 11:53 AM End time: 07/15/2023 11:55 AM Reason for block: surgical anesthesia Staffing Performed: anesthesiologist  Anesthesiologist: Shelton Silvas, MD Performed by: Shelton Silvas, MD Authorized by: Shelton Silvas, MD   Preanesthetic Checklist Completed: patient identified, IV checked, site marked, risks and benefits discussed, surgical consent, monitors and equipment checked, pre-op evaluation and timeout performed Spinal Block Patient position: sitting Prep: DuraPrep and site prepped and draped Location: L3-4 Injection technique: single-shot Needle Needle type: Pencan  Needle gauge: 24 G Needle length: 10 cm Needle insertion depth: 10 cm Additional Notes Patient tolerated well. No immediate complications.  Functioning IV was confirmed and monitors were applied. Sterile prep and drape, including hand hygiene and sterile gloves were used. The patient was positioned and the back was prepped. The skin was anesthetized with lidocaine. Free flow of clear CSF was obtained prior to injecting local anesthetic into the CSF. The spinal needle aspirated freely following injection. The needle was carefully withdrawn. The patient tolerated the procedure well.

## 2023-07-15 NOTE — Interval H&P Note (Signed)
History and Physical Interval Note:  07/15/2023 10:12 AM  Kurt Duke  has presented today for surgery, with the diagnosis of OA RIGHT HIP.  The various methods of treatment have been discussed with the patient and family. After consideration of risks, benefits and other options for treatment, the patient has consented to  Procedure(s): TOTAL HIP ARTHROPLASTY ANTERIOR APPROACH (Right) as a surgical intervention.  The patient's history has been reviewed, patient examined, no change in status, stable for surgery.  I have reviewed the patient's chart and labs.  Questions were answered to the patient's satisfaction.     Sheral Apley

## 2023-07-15 NOTE — Interval H&P Note (Signed)
History and Physical Interval Note:  07/15/2023 8:44 AM  Kurt Duke  has presented today for surgery, with the diagnosis of OA RIGHT HIP.  The various methods of treatment have been discussed with the patient and family. After consideration of risks, benefits and other options for treatment, the patient has consented to  Procedure(s): TOTAL HIP ARTHROPLASTY ANTERIOR APPROACH (Right) as a surgical intervention.  The patient's history has been reviewed, patient examined, no change in status, stable for surgery.  I have reviewed the patient's chart and labs.  Questions were answered to the patient's satisfaction.     Sheral Apley

## 2023-07-15 NOTE — Transfer of Care (Signed)
Immediate Anesthesia Transfer of Care Note  Patient: Kurt Duke  Procedure(s) Performed: TOTAL HIP ARTHROPLASTY ANTERIOR APPROACH (Right: Hip)  Patient Location: PACU  Anesthesia Type:Spinal  Level of Consciousness: awake, oriented, and patient cooperative  Airway & Oxygen Therapy: Patient Spontanous Breathing and Patient connected to face mask oxygen  Post-op Assessment: Report given to RN and Post -op Vital signs reviewed and stable  Post vital signs: Reviewed and stable  Last Vitals:  Vitals Value Taken Time  BP 98/72 07/15/23 1401  Temp    Pulse 85 07/15/23 1401  Resp 15 07/15/23 1401  SpO2 100 % 07/15/23 1401  Vitals shown include unfiled device data.  Last Pain:  Vitals:   07/15/23 0950  TempSrc: Oral  PainSc: 4          Complications: No notable events documented.

## 2023-07-15 NOTE — Op Note (Signed)
07/15/2023  1:38 PM  PATIENT:  Kurt Duke   MRN: [id number]  PRE-OPERATIVE DIAGNOSIS:  OA RIGHT HIP  POST-OPERATIVE DIAGNOSIS:  OA RIGHT HIP  PROCEDURE:  Procedure(s): TOTAL HIP ARTHROPLASTY ANTERIOR APPROACH  PREOPERATIVE INDICATIONS:    Kurt Duke is an 77 y.o. male who has a diagnosis of <principal problem not specified> and elected for surgical management after failing conservative treatment.  The risks benefits and alternatives were discussed with the patient including but not limited to the risks of nonoperative treatment, versus surgical intervention including infection, bleeding, nerve injury, periprosthetic fracture, the need for revision surgery, dislocation, leg length discrepancy, blood clots, cardiopulmonary complications, morbidity, mortality, among others, and they were willing to proceed.     OPERATIVE REPORT     SURGEON:   Sheral Apley, MD    ASSISTANT:  Levester Fresh, PA-C, she was present and scrubbed throughout the case, critical for completion in a timely fashion, and for retraction, instrumentation, and closure.     ANESTHESIA:  General    COMPLICATIONS:  None.     COMPONENTS:  Stryker acolade fit femur size 7 with a 36 mm -5 head ball and an acetabular shell size 54 with a  polyethylene liner    PROCEDURE IN DETAIL:   The patient was met in the holding area and  identified.  The appropriate hip was identified and marked at the operative site.  The patient was then transported to the OR  and  placed under anesthesia per that record.  At that point, the patient was  placed in the supine position and  secured to the operating room table and all bony prominences padded. He received pre-operative antibiotics    The operative lower extremity was prepped from the iliac crest to the distal leg.  Sterile draping was performed.  Time out was performed prior to incision.      Skin incision was made just 2 cm lateral to the ASIS  extending in line with the  tensor fascia lata. Electrocautery was used to control all bleeders. I dissected down sharply to the fascia of the tensor fascia lata was confirmed that the muscle fibers beneath were running posteriorly. I then incised the fascia over the superficial tensor fascia lata in line with the incision. The fascia was elevated off the anterior aspect of the muscle the muscle was retracted posteriorly and protected throughout the case. I then used electrocautery to incise the tensor fascia lata fascia control and all bleeders. Immediately visible was the fat over top of the anterior neck and capsule.  I removed the anterior fat from the capsule and elevated the rectus muscle off of the anterior capsule. I then removed a large time of capsule. The retractors were then placed over the anterior acetabulum as well as around the superior and inferior neck.  I then made a femoral neck cut. Then used the power corkscrew to remove the femoral head from the acetabulum and thoroughly irrigated the acetabulum. I sized the femoral head.    I then exposed the deep acetabulum, cleared out any tissue including the ligamentum teres.   After adequate visualization, I excised the labrum, and then sequentially reamed.  I then impacted the acetabular implant into place using fluoroscopy for guidance.  Appropriate version and inclination was confirmed clinically matching their bony anatomy, and with fluoroscopy.  I placed a 20 mm screw in the posterior/superio position with an excellent bite.    I then placed the polyethylene liner in  place  I then adducted the leg and released the external rotators from the posterior femur allowing it to be easily delivered up lateral and anterior to the acetabulum for preparation of the femoral canal.    I then prepared the proximal femur using the cookie-cutter and then sequentially reamed and broached.  A trial broach, neck, and head was utilized, and I reduced the hip and used floroscopy to  assess the neck length and femoral implant.  I then impacted the femoral prosthesis into place into the appropriate version. The hip was then reduced and fluoroscopy confirmed appropriate position. Leg lengths were restored.  I then irrigated the hip copiously again with, and repaired the fascia with Vicryl, followed by monocryl for the subcutaneous tissue, Monocryl for the skin, Steri-Strips and sterile gauze. The patient was then awakened and returned to PACU in stable and satisfactory condition. There were no complications.  POST OPERATIVE PLAN: WBAT, DVT px: SCD's/TED, ambulation and chemical dvt px  Margarita Rana, MD Orthopedic Surgeon [phone]

## 2023-07-15 NOTE — Evaluation (Signed)
Physical Therapy Evaluation Patient Details Name: Kurt Duke MRN: [id number] DOB: Mar 16, 1946 Today's Date: 07/15/2023  History of Present Illness  77 yo male presents to therapy s/p R THA, anterior approach on 07/15/2023 due to failure of conservative measures. Pt PMH includes but is not limited to: CKD III, DJD cervical region, GERD, varicose veins, LBP, HLD, spinal stenosis, and cardiac surgeries.  Clinical Impression    Kurt Duke is a 77 y.o. male POD 0 s/p R THA. Patient reports IND with mobility at baseline. Patient is now limited by functional impairments (see PT problem list below) and requires CGA and cues for transfers and gait with RW. Patient was able to ambulate 55 feet with RW and CGA and cues for safe walker management. Patient educated on safe sequencing for stair mobility with B handrails, fall risk prevention, safe and proper use of RW, pain goal and management, use of CP/ice and car transfers pt and spouse un verbalized understanding of safe guarding position for people assisting with mobility. Patient instructed in exercises to facilitate ROM and circulation reviewed and HO provided. Patient will benefit from continued skilled PT interventions to address impairments and progress towards PLOF. Patient has met mobility goals at adequate level for discharge home with family support and OPPT services scheduled for 10/17; will continue to follow if pt continues acute stay to progress towards Mod I goals.       If plan is discharge home, recommend the following: A little help with walking and/or transfers;A little help with bathing/dressing/bathroom;Assistance with cooking/housework;Assist for transportation;Help with stairs or ramp for entrance   Can travel by private vehicle        Equipment Recommendations None recommended by PT  Recommendations for Other Services       Functional Status Assessment Patient has had a recent decline in their functional status and  demonstrates the ability to make significant improvements in function in a reasonable and predictable amount of time.     Precautions / Restrictions Precautions Precautions: Fall Restrictions Weight Bearing Restrictions: No      Mobility  Bed Mobility Overal bed mobility: Needs Assistance Bed Mobility: Supine to Sit     Supine to sit: Contact guard, HOB elevated, Used rails     General bed mobility comments: min cues    Transfers Overall transfer level: Needs assistance Equipment used: Rolling walker (2 wheels) Transfers: Sit to/from Stand Sit to Stand: Contact guard assist, From elevated surface           General transfer comment: min cues for transfer tasks including recliner and bed and verbally car    Ambulation/Gait Ambulation/Gait assistance: Contact guard assist Gait Distance (Feet): 55 Feet Assistive device: Rolling walker (2 wheels) Gait Pattern/deviations: Step-to pattern, Antalgic, Trunk flexed Gait velocity: decreased     General Gait Details: step almost througth cues for sequencing and continuous movement of RW  Stairs Stairs: Yes Stairs assistance: Contact guard assist Stair Management: Two rails Number of Stairs: 3 General stair comments: 3 x 2 with steps with B handrail and cues, pt is able to recall proper sequencing  Wheelchair Mobility     Tilt Bed    Modified Rankin (Stroke Patients Only)       Balance Overall balance assessment: Needs assistance Sitting-balance support: Feet supported Sitting balance-Leahy Scale: Good     Standing balance support: Bilateral upper extremity supported, During functional activity, Reliant on assistive device for balance Standing balance-Leahy Scale: Poor  Pertinent Vitals/Pain Pain Assessment Pain Assessment: 0-10 Pain Score: 4  Pain Location: R hip Pain Descriptors / Indicators: Aching, Constant, Discomfort, Grimacing, Operative site  guarding Pain Intervention(s): Limited activity within patient's tolerance, Monitored during session, Repositioned, Premedicated before session, Ice applied    Home Living Family/patient expects to be discharged to:: Private residence Living Arrangements: Spouse/significant other Available Help at Discharge: Family Type of Home: House Home Access: Stairs to enter Entrance Stairs-Rails: Right;Left;Can reach both Entrance Stairs-Number of Steps: 3 Alternate Level Stairs-Number of Steps: 8 Home Layout: Multi-level;Bed/bath upstairs Home Equipment: Teacher, English as a foreign language (2 wheels);Cane - single point (hand held shower head)      Prior Function Prior Level of Function : Independent/Modified Independent;Driving (active playing pickleball)             Mobility Comments: IND no AD for all ADLs, self care tasks and IADLs       Extremity/Trunk Assessment        Lower Extremity Assessment Lower Extremity Assessment: RLE deficits/detail RLE Deficits / Details: ankle DF 3/5 and PF 5/5 RLE Sensation: decreased light touch;decreased proprioception (pt reports B feet abn sensation)    Cervical / Trunk Assessment Cervical / Trunk Assessment: Normal  Communication   Communication Communication: No apparent difficulties  Cognition Arousal: Alert Behavior During Therapy: WFL for tasks assessed/performed Overall Cognitive Status: Within Functional Limits for tasks assessed                                          General Comments      Exercises Total Joint Exercises Ankle Circles/Pumps: AROM, Both, 15 reps Quad Sets: AROM, 5 reps, Right Gluteal Sets: AROM, Both, 5 reps Heel Slides: AROM, Right, 5 reps, Supine Hip ABduction/ADduction: AROM, 5 reps, Standing, Both Long Arc Quad: AROM, 5 reps, Seated, Both Knee Flexion: AROM, Both, 5 reps, Standing Standing Hip Extension: AROM, Right, 5 reps   Assessment/Plan    PT Assessment Patient needs continued PT  services  PT Problem List Decreased strength;Decreased range of motion;Decreased activity tolerance;Decreased balance;Decreased mobility;Decreased coordination;Pain       PT Treatment Interventions DME instruction;Gait training;Stair training;Functional mobility training;Therapeutic activities;Therapeutic exercise;Balance training;Neuromuscular re-education;Patient/family education;Modalities    PT Goals (Current goals can be found in the Care Plan section)  Acute Rehab PT Goals Patient Stated Goal: to be able to return to playing pickleball, walk, go to church PT Goal Formulation: With patient Time For Goal Achievement: 07/29/23 Potential to Achieve Goals: Good    Frequency 7X/week     Co-evaluation               AM-PAC PT "6 Clicks" Mobility  Outcome Measure Help needed turning from your back to your side while in a flat bed without using bedrails?: A Little Help needed moving from lying on your back to sitting on the side of a flat bed without using bedrails?: A Little Help needed moving to and from a bed to a chair (including a wheelchair)?: A Little Help needed standing up from a chair using your arms (e.g., wheelchair or bedside chair)?: A Little Help needed to walk in hospital room?: A Little Help needed climbing 3-5 steps with a railing? : A Little 6 Click Score: 18    End of Session Equipment Utilized During Treatment: Gait belt Activity Tolerance: Patient tolerated treatment well Patient left: in chair;with call bell/phone within reach Nurse Communication: Mobility status;Other (comment) (  pt readiness for d/c from PT standpoint) PT Visit Diagnosis: Other abnormalities of gait and mobility (R26.89);Unsteadiness on feet (R26.81);Muscle weakness (generalized) (M62.81);Difficulty in walking, not elsewhere classified (R26.2);Pain Pain - Right/Left: Right Pain - part of body: Hip;Leg    Time: 1630-1710 PT Time Calculation (min) (ACUTE ONLY): 40 min   Charges:    PT Evaluation $PT Eval Low Complexity: 1 Low PT Treatments $Gait Training: 8-22 mins $Therapeutic Exercise: 8-22 mins PT General Charges $$ ACUTE PT VISIT: 1 Visit         Johnny Bridge, PT Acute Rehab   Jacqualyn Posey 07/15/2023, 5:18 PM

## 2023-07-16 NOTE — Anesthesia Postprocedure Evaluation (Signed)
Anesthesia Post Note  Patient: Kurt Duke  Procedure(s) Performed: TOTAL HIP ARTHROPLASTY ANTERIOR APPROACH (Right: Hip)     Patient location during evaluation: PACU Anesthesia Type: Spinal Level of consciousness: oriented and awake and alert Pain management: pain level controlled Vital Signs Assessment: post-procedure vital signs reviewed and stable Respiratory status: spontaneous breathing, respiratory function stable and patient connected to nasal cannula oxygen Cardiovascular status: blood pressure returned to baseline and stable Postop Assessment: no headache, no backache and no apparent nausea or vomiting Anesthetic complications: no   No notable events documented.              Shelton Silvas

## 2023-07-17 ENCOUNTER — Encounter (HOSPITAL_COMMUNITY): Payer: Self-pay | Admitting: Orthopedic Surgery

## 2023-07-17 DIAGNOSIS — M25551 Pain in right hip: Secondary | ICD-10-CM | POA: Diagnosis not present

## 2023-07-22 DIAGNOSIS — M25551 Pain in right hip: Secondary | ICD-10-CM | POA: Diagnosis not present

## 2023-07-24 DIAGNOSIS — M25551 Pain in right hip: Secondary | ICD-10-CM | POA: Diagnosis not present

## 2023-07-29 DIAGNOSIS — M25551 Pain in right hip: Secondary | ICD-10-CM | POA: Diagnosis not present

## 2023-07-30 DIAGNOSIS — M1611 Unilateral primary osteoarthritis, right hip: Secondary | ICD-10-CM | POA: Diagnosis not present

## 2023-07-31 DIAGNOSIS — M25551 Pain in right hip: Secondary | ICD-10-CM | POA: Diagnosis not present

## 2023-08-04 DIAGNOSIS — M25551 Pain in right hip: Secondary | ICD-10-CM | POA: Diagnosis not present

## 2023-08-06 DIAGNOSIS — Z299 Encounter for prophylactic measures, unspecified: Secondary | ICD-10-CM | POA: Diagnosis not present

## 2023-08-06 DIAGNOSIS — D649 Anemia, unspecified: Secondary | ICD-10-CM | POA: Diagnosis not present

## 2023-08-06 DIAGNOSIS — M25551 Pain in right hip: Secondary | ICD-10-CM | POA: Diagnosis not present

## 2023-08-06 DIAGNOSIS — D696 Thrombocytopenia, unspecified: Secondary | ICD-10-CM | POA: Diagnosis not present

## 2023-08-06 DIAGNOSIS — G4700 Insomnia, unspecified: Secondary | ICD-10-CM | POA: Diagnosis not present

## 2023-08-11 DIAGNOSIS — M25551 Pain in right hip: Secondary | ICD-10-CM | POA: Diagnosis not present

## 2023-08-14 DIAGNOSIS — M25551 Pain in right hip: Secondary | ICD-10-CM | POA: Diagnosis not present

## 2023-09-01 DIAGNOSIS — M1611 Unilateral primary osteoarthritis, right hip: Secondary | ICD-10-CM | POA: Diagnosis not present

## 2023-09-05 DIAGNOSIS — M25551 Pain in right hip: Secondary | ICD-10-CM | POA: Diagnosis not present

## 2023-09-16 DIAGNOSIS — M25551 Pain in right hip: Secondary | ICD-10-CM | POA: Diagnosis not present

## 2023-09-22 DIAGNOSIS — M25551 Pain in right hip: Secondary | ICD-10-CM | POA: Diagnosis not present

## 2023-10-28 DIAGNOSIS — H353122 Nonexudative age-related macular degeneration, left eye, intermediate dry stage: Secondary | ICD-10-CM | POA: Diagnosis not present

## 2023-10-29 DIAGNOSIS — M25511 Pain in right shoulder: Secondary | ICD-10-CM | POA: Diagnosis not present

## 2023-10-29 DIAGNOSIS — M25551 Pain in right hip: Secondary | ICD-10-CM | POA: Diagnosis not present

## 2023-11-06 DIAGNOSIS — I10 Essential (primary) hypertension: Secondary | ICD-10-CM | POA: Diagnosis not present

## 2023-11-06 DIAGNOSIS — M545 Low back pain, unspecified: Secondary | ICD-10-CM | POA: Diagnosis not present

## 2023-11-06 DIAGNOSIS — D649 Anemia, unspecified: Secondary | ICD-10-CM | POA: Diagnosis not present

## 2023-11-06 DIAGNOSIS — Z299 Encounter for prophylactic measures, unspecified: Secondary | ICD-10-CM | POA: Diagnosis not present

## 2023-11-06 DIAGNOSIS — D696 Thrombocytopenia, unspecified: Secondary | ICD-10-CM | POA: Diagnosis not present

## 2024-02-11 DIAGNOSIS — M542 Cervicalgia: Secondary | ICD-10-CM | POA: Diagnosis not present

## 2024-02-11 DIAGNOSIS — G5601 Carpal tunnel syndrome, right upper limb: Secondary | ICD-10-CM | POA: Diagnosis not present

## 2024-02-11 DIAGNOSIS — M25511 Pain in right shoulder: Secondary | ICD-10-CM | POA: Diagnosis not present

## 2024-03-01 DIAGNOSIS — Z299 Encounter for prophylactic measures, unspecified: Secondary | ICD-10-CM | POA: Diagnosis not present

## 2024-03-01 DIAGNOSIS — E7800 Pure hypercholesterolemia, unspecified: Secondary | ICD-10-CM | POA: Diagnosis not present

## 2024-03-01 DIAGNOSIS — Z0000 Encounter for general adult medical examination without abnormal findings: Secondary | ICD-10-CM | POA: Diagnosis not present

## 2024-03-01 DIAGNOSIS — Z7189 Other specified counseling: Secondary | ICD-10-CM | POA: Diagnosis not present

## 2024-03-01 DIAGNOSIS — Z1389 Encounter for screening for other disorder: Secondary | ICD-10-CM | POA: Diagnosis not present

## 2024-03-01 DIAGNOSIS — M179 Osteoarthritis of knee, unspecified: Secondary | ICD-10-CM | POA: Diagnosis not present

## 2024-04-07 DIAGNOSIS — M25511 Pain in right shoulder: Secondary | ICD-10-CM | POA: Diagnosis not present

## 2024-04-08 DIAGNOSIS — M25511 Pain in right shoulder: Secondary | ICD-10-CM | POA: Diagnosis not present

## 2024-04-23 DIAGNOSIS — S46011A Strain of muscle(s) and tendon(s) of the rotator cuff of right shoulder, initial encounter: Secondary | ICD-10-CM | POA: Diagnosis not present

## 2024-04-27 DIAGNOSIS — Z0000 Encounter for general adult medical examination without abnormal findings: Secondary | ICD-10-CM | POA: Diagnosis not present

## 2024-04-27 DIAGNOSIS — E7800 Pure hypercholesterolemia, unspecified: Secondary | ICD-10-CM | POA: Diagnosis not present

## 2024-04-27 DIAGNOSIS — G4700 Insomnia, unspecified: Secondary | ICD-10-CM | POA: Diagnosis not present

## 2024-04-27 DIAGNOSIS — Z299 Encounter for prophylactic measures, unspecified: Secondary | ICD-10-CM | POA: Diagnosis not present

## 2024-04-27 DIAGNOSIS — H6991 Unspecified Eustachian tube disorder, right ear: Secondary | ICD-10-CM | POA: Diagnosis not present

## 2024-04-27 DIAGNOSIS — I8390 Asymptomatic varicose veins of unspecified lower extremity: Secondary | ICD-10-CM | POA: Diagnosis not present

## 2024-04-27 DIAGNOSIS — Z79899 Other long term (current) drug therapy: Secondary | ICD-10-CM | POA: Diagnosis not present

## 2024-04-27 DIAGNOSIS — R5383 Other fatigue: Secondary | ICD-10-CM | POA: Diagnosis not present

## 2024-04-29 DIAGNOSIS — M7501 Adhesive capsulitis of right shoulder: Secondary | ICD-10-CM | POA: Diagnosis not present

## 2024-04-29 DIAGNOSIS — M7541 Impingement syndrome of right shoulder: Secondary | ICD-10-CM | POA: Diagnosis not present

## 2024-04-29 DIAGNOSIS — M24111 Other articular cartilage disorders, right shoulder: Secondary | ICD-10-CM | POA: Diagnosis not present

## 2024-04-29 DIAGNOSIS — G8918 Other acute postprocedural pain: Secondary | ICD-10-CM | POA: Diagnosis not present

## 2024-04-29 DIAGNOSIS — M7521 Bicipital tendinitis, right shoulder: Secondary | ICD-10-CM | POA: Diagnosis not present

## 2024-04-29 DIAGNOSIS — S46011A Strain of muscle(s) and tendon(s) of the rotator cuff of right shoulder, initial encounter: Secondary | ICD-10-CM | POA: Diagnosis not present

## 2024-05-03 DIAGNOSIS — M25511 Pain in right shoulder: Secondary | ICD-10-CM | POA: Diagnosis not present

## 2024-05-07 DIAGNOSIS — M25511 Pain in right shoulder: Secondary | ICD-10-CM | POA: Diagnosis not present

## 2024-05-10 DIAGNOSIS — M24111 Other articular cartilage disorders, right shoulder: Secondary | ICD-10-CM | POA: Diagnosis not present

## 2024-05-11 DIAGNOSIS — M25511 Pain in right shoulder: Secondary | ICD-10-CM | POA: Diagnosis not present

## 2024-05-13 DIAGNOSIS — M25511 Pain in right shoulder: Secondary | ICD-10-CM | POA: Diagnosis not present

## 2024-05-18 DIAGNOSIS — M25511 Pain in right shoulder: Secondary | ICD-10-CM | POA: Diagnosis not present

## 2024-05-20 DIAGNOSIS — M25511 Pain in right shoulder: Secondary | ICD-10-CM | POA: Diagnosis not present

## 2024-05-25 DIAGNOSIS — M25511 Pain in right shoulder: Secondary | ICD-10-CM | POA: Diagnosis not present

## 2024-05-27 DIAGNOSIS — M25511 Pain in right shoulder: Secondary | ICD-10-CM | POA: Diagnosis not present

## 2024-06-01 DIAGNOSIS — M25511 Pain in right shoulder: Secondary | ICD-10-CM | POA: Diagnosis not present

## 2024-06-03 DIAGNOSIS — M25511 Pain in right shoulder: Secondary | ICD-10-CM | POA: Diagnosis not present

## 2024-06-08 DIAGNOSIS — M25511 Pain in right shoulder: Secondary | ICD-10-CM | POA: Diagnosis not present

## 2024-06-10 DIAGNOSIS — M25511 Pain in right shoulder: Secondary | ICD-10-CM | POA: Diagnosis not present

## 2024-06-14 DIAGNOSIS — M25511 Pain in right shoulder: Secondary | ICD-10-CM | POA: Diagnosis not present

## 2024-06-15 ENCOUNTER — Ambulatory Visit: Attending: Cardiology | Admitting: Cardiology

## 2024-06-15 ENCOUNTER — Encounter: Payer: Self-pay | Admitting: Cardiology

## 2024-06-15 VITALS — BP 128/76 | HR 74 | Ht 71.0 in | Wt 180.4 lb

## 2024-06-15 DIAGNOSIS — E782 Mixed hyperlipidemia: Secondary | ICD-10-CM

## 2024-06-15 DIAGNOSIS — R000 Tachycardia, unspecified: Secondary | ICD-10-CM | POA: Diagnosis not present

## 2024-06-15 DIAGNOSIS — I2089 Other forms of angina pectoris: Secondary | ICD-10-CM

## 2024-06-15 NOTE — Patient Instructions (Addendum)

## 2024-06-15 NOTE — Progress Notes (Signed)
    Cardiology Office Note  Date: 06/15/2024   ID: Kurt Duke, DOB 01/11/1946, MRN [id number]  History of Present Illness: Kurt Duke is a 78 y.o. male last seen in July 2024.  He is here for a routine visit.  Recently underwent right rotator cuff surgery, undergoing PT now and just came out of his sling.  He has not been very active during this time, eager to get back to pickleball.  We did talk about the basic walking plan for now.  He has had some mild ankle edema in the evenings.  He does not report any angina.  Medications reviewed.  He reports compliance with current regimen.  I reviewed his interval lab work, LDL was 60 with HDL 66 in July.  I reviewed his ECG today which shows normal sinus rhythm with incomplete right bundle branch block and left anterior fascicular block.  Physical Exam: VS:  BP 128/76   Pulse 74   Ht 5' 11 (1.803 m)   Wt 180 lb 6.4 oz (81.8 kg)   SpO2 97%   BMI 25.16 kg/m , BMI Body mass index is 25.16 kg/m.  Wt Readings from Last 3 Encounters:  06/15/24 180 lb 6.4 oz (81.8 kg)  07/15/23 176 lb (79.8 kg)  07/02/23 176 lb (79.8 kg)    General: Patient appears comfortable at rest. HEENT: Conjunctiva and lids normal. Neck: Supple, no elevated JVP or carotid bruits. Lungs: Clear to auscultation, nonlabored breathing at rest. Cardiac: Regular rate and rhythm, no S3 or significant systolic murmur.  ECG:  An ECG dated 08/05/2022 was personally reviewed today and demonstrated:  Sinus tachycardia with right bundle branch block and left anterior fascicular block.  Labwork: 07/02/2023: BUN 27; Creatinine, Ser 1.03; Hemoglobin 12.8; Platelets 145; Potassium 4.1; Sodium 136     Component Value Date/Time   CHOL 139 03/28/2023 0949   TRIG 63 03/28/2023 0949   HDL 65 03/28/2023 0949   CHOLHDL 2.1 03/28/2023 0949   CHOLHDL 2.7 04/17/2010 0534   VLDL 14 04/17/2010 0534   LDLCALC 61 03/28/2023 0949  July 2025: Hemoglobin 12, platelets 177, BUN 24,  creatinine 1, potassium 4.1, AST 26, ALT 14, GFR 77, cholesterol 139, triglycerides 60, HDL 66, LDL 60, TSH 1.71  Other Studies Reviewed Today:  No interval cardiac testing for review today.  Assessment and Plan:  1.  Microvascular angina with cardiac catheterization in September 2023 showing no significant obstructive CAD.  Continue aspirin  81 mg daily and Crestor  5 mg twice weekly.  He will get back to regular activity as he recovers from right rotator cuff surgery.  Recommended walking plan for now.   2.  Mixed hyperlipidemia.  LDL 60 in July.  Continue Crestor  5 mg twice weekly.  Disposition:  Follow up 1 year.  Signed, Jayson JUDITHANN Sierras, M.D., F.A.C.C. [facility]

## 2024-06-17 DIAGNOSIS — M25511 Pain in right shoulder: Secondary | ICD-10-CM | POA: Diagnosis not present

## 2024-06-18 DIAGNOSIS — D3132 Benign neoplasm of left choroid: Secondary | ICD-10-CM | POA: Diagnosis not present

## 2024-06-18 DIAGNOSIS — H524 Presbyopia: Secondary | ICD-10-CM | POA: Diagnosis not present

## 2024-06-21 DIAGNOSIS — M25511 Pain in right shoulder: Secondary | ICD-10-CM | POA: Diagnosis not present

## 2024-06-24 DIAGNOSIS — M25511 Pain in right shoulder: Secondary | ICD-10-CM | POA: Diagnosis not present

## 2024-06-28 DIAGNOSIS — M25511 Pain in right shoulder: Secondary | ICD-10-CM | POA: Diagnosis not present

## 2024-07-01 DIAGNOSIS — M25511 Pain in right shoulder: Secondary | ICD-10-CM | POA: Diagnosis not present

## 2024-07-05 DIAGNOSIS — M25511 Pain in right shoulder: Secondary | ICD-10-CM | POA: Diagnosis not present

## 2024-07-06 DIAGNOSIS — L573 Poikiloderma of Civatte: Secondary | ICD-10-CM | POA: Diagnosis not present

## 2024-07-08 DIAGNOSIS — M25511 Pain in right shoulder: Secondary | ICD-10-CM | POA: Diagnosis not present

## 2024-07-12 DIAGNOSIS — M25511 Pain in right shoulder: Secondary | ICD-10-CM | POA: Diagnosis not present

## 2024-07-15 DIAGNOSIS — M25511 Pain in right shoulder: Secondary | ICD-10-CM | POA: Diagnosis not present

## 2024-07-21 DIAGNOSIS — Z9889 Other specified postprocedural states: Secondary | ICD-10-CM | POA: Diagnosis not present

## 2024-07-21 DIAGNOSIS — M25311 Other instability, right shoulder: Secondary | ICD-10-CM | POA: Diagnosis not present

## 2024-07-23 DIAGNOSIS — D649 Anemia, unspecified: Secondary | ICD-10-CM | POA: Diagnosis not present

## 2024-07-26 DIAGNOSIS — M25511 Pain in right shoulder: Secondary | ICD-10-CM | POA: Diagnosis not present

## 2024-07-28 DIAGNOSIS — M25511 Pain in right shoulder: Secondary | ICD-10-CM | POA: Diagnosis not present
